# Patient Record
Sex: Female | Born: 1970 | Race: Asian | Hispanic: No | Marital: Married | Smoking: Never smoker
Health system: Southern US, Community
[De-identification: ages and names within clinical notes are randomized; demographics above are authoritative.]

## PROBLEM LIST (undated history)

## (undated) DIAGNOSIS — M419 Scoliosis, unspecified: Secondary | ICD-10-CM

## (undated) HISTORY — PX: BACK SURGERY: SHX140

---

## 2012-01-21 ENCOUNTER — Other Ambulatory Visit: Payer: Self-pay | Admitting: Orthopaedic Surgery

## 2012-01-21 DIAGNOSIS — IMO0002 Reserved for concepts with insufficient information to code with codable children: Secondary | ICD-10-CM

## 2012-01-22 ENCOUNTER — Ambulatory Visit
Admission: RE | Admit: 2012-01-22 | Discharge: 2012-01-22 | Disposition: A | Discharge status: Home / Self Care | Payer: BC Managed Care – PPO | Source: Ambulatory Visit | Attending: Orthopaedic Surgery | Admitting: Orthopaedic Surgery

## 2012-01-22 VITALS — BP 104/73 | HR 81 | Ht <= 58 in | Wt 115.0 lb

## 2012-01-22 DIAGNOSIS — IMO0002 Reserved for concepts with insufficient information to code with codable children: Secondary | ICD-10-CM

## 2012-01-22 MED ORDER — MEPERIDINE HCL 100 MG/ML IJ SOLN
50.0000 mg | Freq: Once | INTRAMUSCULAR | Status: AC
  Administered 2012-01-22: 50 mg via INTRAMUSCULAR

## 2012-01-22 MED ORDER — DIAZEPAM 5 MG PO TABS
5.0000 mg | ORAL_TABLET | Freq: Once | ORAL | Status: AC
  Administered 2012-01-22: 5 mg via ORAL

## 2012-01-22 MED ORDER — ONDANSETRON HCL 4 MG/2ML IJ SOLN
4.0000 mg | Freq: Once | INTRAMUSCULAR | Status: AC
  Administered 2012-01-22: 4 mg via INTRAMUSCULAR

## 2012-01-22 MED ORDER — IOHEXOL 180 MG/ML  SOLN
17.0000 mL | Freq: Once | INTRAMUSCULAR | Status: AC | PRN
  Administered 2012-01-22: 17 mL via INTRATHECAL

## 2012-03-10 ENCOUNTER — Encounter (HOSPITAL_COMMUNITY): Payer: Self-pay | Admitting: Pharmacy Technician

## 2012-03-16 ENCOUNTER — Encounter (HOSPITAL_COMMUNITY): Payer: Self-pay

## 2012-03-16 ENCOUNTER — Encounter (HOSPITAL_COMMUNITY)
Admission: RE | Admit: 2012-03-16 | Discharge: 2012-03-16 | Disposition: A | Discharge status: Home / Self Care | Payer: BC Managed Care – PPO | Source: Ambulatory Visit | Attending: Orthopaedic Surgery | Admitting: Orthopaedic Surgery

## 2012-03-16 HISTORY — DX: Scoliosis, unspecified: M41.9

## 2012-03-16 LAB — TYPE AND SCREEN
ABO/RH(D): O POS
Antibody Screen: NEGATIVE

## 2012-03-16 LAB — SURGICAL PCR SCREEN: Staphylococcus aureus: NEGATIVE

## 2012-03-16 NOTE — Pre-Procedure Instructions (Signed)
21 Danielle Watkins  03/16/2012   Your procedure is scheduled on:  Monday November 11  Report to [facility] at 10:30 AM.  Call this number if you have problems the morning of surgery: [phone]   Remember:   Do not eat or drink:After Midnight.    Take these medicines the morning of surgery with A SIP OF WATER: none   Do not wear jewelry, make-up or nail polish.  Do not wear lotions, powders, or perfumes. You may wear deodorant.  Do not shave 48 hours prior to surgery. Men may shave face and neck.  Do not bring valuables to the hospital.  Contacts, dentures or bridgework may not be worn into surgery.  Leave suitcase in the car. After surgery it may be brought to your room.  For patients admitted to the hospital, checkout time is 11:00 AM the day of discharge.   Patients discharged the day of surgery will not be allowed to drive home.  Name and phone number of your driver: NA  Special Instructions: Shower using CHG 2 nights before surgery and the night before surgery.  If you shower the day of surgery use CHG.  Use special wash - you have one bottle of CHG for all showers.  You should use approximately 1/3 of the bottle for each shower.   Please read over the following fact sheets that you were given: Pain Booklet, Coughing and Deep Breathing, Blood Transfusion Information and Surgical Site Infection Prevention

## 2012-03-16 NOTE — Progress Notes (Signed)
Need surgery orders, office aware and Dr. Yates will put orders in EPIC.  

## 2012-03-19 NOTE — H&P (Signed)
Danielle Watkins is an 41 y.o. female.   Chief Complaint: back pain and left leg pain HPI: Pt is S/P lumbar fusion at L5-S1 in [place] in 2007. Now with progressive back pain and burning pain in the left leg.  The pain is worse with activity and with sitting.  She has tried physical therapy without relief of her symptoms. Pt has used meloxicam and flexeril for her symptoms without much relief. CT of lumbar spine indicates broken rod on the right at L5-S1 and spondylolysis bilateral L5 with pseudoarthrosis. Also broad based disc bulge on the left at L4-5 with lateral recess stenosis. After risk and benefits of procedure discussed with DR Ophelia Charter, pt wishes to proceed with removal of hardware, rearthrodesis and redo instrumentation at L5-S1 with TLIF  and microdiscectomy Left L4-5 with lateral recess decompression.  Pts husband has obtained the information regarding the implanted hardware and it will be made available for use by Dr Ophelia Charter for the procedure.   Past Medical History  Diagnosis Date  . Scoliosis     Past Surgical History  Procedure Date  . Back surgery     lumbar fusion    No family history on file. Social History:  reports that she has never smoked. She has never used smokeless tobacco. She reports that she does not drink alcohol or use illicit drugs.  Allergies: No Known Allergies  Medications Prior to Admission  Medication Sig Dispense Refill  . Multiple Vitamin (MULTIVITAMIN WITH MINERALS) TABS Take 1 tablet by mouth daily.      Marland Kitchen OVER THE COUNTER MEDICATION Take 1 tablet by mouth daily. Fertility Blend OTC        No results found for this or any previous visit (from the past 48 hour(s)). No results found.  Review of Systems  Constitutional: Negative.   HENT: Negative.   Eyes: Negative.   Respiratory: Negative.   Genitourinary: Negative.   Musculoskeletal:       Burning and pain in back and left leg.  Worse with activity  Skin: Negative.   Endo/Heme/Allergies: Negative.       Blood pressure 129/89, pulse 88, temperature 97.8 F (36.6 C), temperature source Oral, resp. rate 20, SpO2 100.00%. Physical Exam  Constitutional: She is oriented to person, place, and time. She appears well-developed and well-nourished.  HENT:  Head: Normocephalic.  Eyes: EOM are normal. Pupils are equal, round, and reactive to light.  Neck: Normal range of motion. Neck supple.  Cardiovascular: Normal rate.   Respiratory: Effort normal.  Musculoskeletal:       Able to heel and toe walk without weakness.  +sciatic notch tenderness left.  +SLR left.    Neurological: She is oriented to person, place, and time. She has normal reflexes.  Skin: Skin is warm and dry.  Psychiatric: She has a normal mood and affect.     Assessment/Plan Left L4-5 disc protrusion with lateral recess stenosis and  L5-S1 pseudoarthrosis with broken rod  PLAN:  Removal of broken rod and pedicle instrumentation L5-S1, Left L5-S1 TLIF with possible peek cage and revision pedicle screw and rod instrumentation. Left L4-5 microdiscectomy  Zyrus Hetland C 03/22/2012, 12:57 PM

## 2012-03-22 ENCOUNTER — Other Ambulatory Visit (HOSPITAL_COMMUNITY): Payer: Self-pay | Admitting: Orthopedic Surgery

## 2012-03-22 ENCOUNTER — Inpatient Hospital Stay (HOSPITAL_COMMUNITY)
Admission: RE | Admit: 2012-03-22 | Discharge: 2012-03-24 | DRG: 755 | Disposition: A | Discharge status: Home / Self Care | Payer: BC Managed Care – PPO | Source: Ambulatory Visit | Attending: Orthopaedic Surgery | Admitting: Orthopaedic Surgery

## 2012-03-22 ENCOUNTER — Ambulatory Visit (HOSPITAL_COMMUNITY): Payer: BC Managed Care – PPO | Admitting: Certified Registered"

## 2012-03-22 ENCOUNTER — Encounter (HOSPITAL_COMMUNITY): Payer: Self-pay | Admitting: Certified Registered"

## 2012-03-22 ENCOUNTER — Encounter (HOSPITAL_COMMUNITY)
Admission: RE | Disposition: A | Discharge status: Home / Self Care | Payer: Self-pay | Source: Ambulatory Visit | Attending: Orthopaedic Surgery

## 2012-03-22 ENCOUNTER — Ambulatory Visit (HOSPITAL_COMMUNITY): Payer: BC Managed Care – PPO

## 2012-03-22 DIAGNOSIS — T8489XA Other specified complication of internal orthopedic prosthetic devices, implants and grafts, initial encounter: Secondary | ICD-10-CM | POA: Diagnosis present

## 2012-03-22 DIAGNOSIS — T84498A Other mechanical complication of other internal orthopedic devices, implants and grafts, initial encounter: Principal | ICD-10-CM | POA: Diagnosis present

## 2012-03-22 DIAGNOSIS — Z01812 Encounter for preprocedural laboratory examination: Secondary | ICD-10-CM

## 2012-03-22 DIAGNOSIS — Y831 Surgical operation with implant of artificial internal device as the cause of abnormal reaction of the patient, or of later complication, without mention of misadventure at the time of the procedure: Secondary | ICD-10-CM | POA: Diagnosis present

## 2012-03-22 DIAGNOSIS — M47817 Spondylosis without myelopathy or radiculopathy, lumbosacral region: Secondary | ICD-10-CM | POA: Diagnosis present

## 2012-03-22 DIAGNOSIS — M5126 Other intervertebral disc displacement, lumbar region: Secondary | ICD-10-CM | POA: Diagnosis present

## 2012-03-22 DIAGNOSIS — M4306 Spondylolysis, lumbar region: Secondary | ICD-10-CM | POA: Diagnosis present

## 2012-03-22 DIAGNOSIS — D62 Acute posthemorrhagic anemia: Secondary | ICD-10-CM | POA: Diagnosis not present

## 2012-03-22 SURGERY — POSTERIOR LUMBAR FUSION 1 WITH HARDWARE REMOVAL
Anesthesia: General | Site: Back | Wound class: Clean

## 2012-03-22 MED ORDER — CEFAZOLIN SODIUM 1-5 GM-% IV SOLN
INTRAVENOUS | Status: DC | PRN
  Administered 2012-03-22 (×2): 1 g via INTRAVENOUS

## 2012-03-22 MED ORDER — ACETAMINOPHEN 10 MG/ML IV SOLN: 1000.0000 mg | Freq: Once | INTRAVENOUS | Status: DC | PRN

## 2012-03-22 MED ORDER — ALBUMIN HUMAN 5 % IV SOLN
INTRAVENOUS | Status: DC | PRN
  Administered 2012-03-22: 16:00:00 via INTRAVENOUS

## 2012-03-22 MED ORDER — SODIUM CHLORIDE 0.9 % IJ SOLN: 3.0000 mL | INTRAMUSCULAR | Status: DC | PRN

## 2012-03-22 MED ORDER — MIDAZOLAM HCL 5 MG/5ML IJ SOLN
INTRAMUSCULAR | Status: DC | PRN
  Administered 2012-03-22: 2 mg via INTRAVENOUS

## 2012-03-22 MED ORDER — ACETAMINOPHEN 325 MG PO TABS
650.0000 mg | ORAL_TABLET | ORAL | Status: DC | PRN
  Administered 2012-03-24: 650 mg via ORAL
  Filled 2012-03-22: qty 2

## 2012-03-22 MED ORDER — SENNOSIDES-DOCUSATE SODIUM 8.6-50 MG PO TABS: 1.0000 | ORAL_TABLET | Freq: Every evening | ORAL | Status: DC | PRN

## 2012-03-22 MED ORDER — PHENYLEPHRINE HCL 10 MG/ML IJ SOLN
INTRAMUSCULAR | Status: DC | PRN
  Administered 2012-03-22 (×5): 40 ug via INTRAVENOUS

## 2012-03-22 MED ORDER — PROPOFOL 10 MG/ML IV BOLUS
INTRAVENOUS | Status: DC | PRN
  Administered 2012-03-22: 150 mg via INTRAVENOUS

## 2012-03-22 MED ORDER — FENTANYL CITRATE 0.05 MG/ML IJ SOLN
INTRAMUSCULAR | Status: DC | PRN
  Administered 2012-03-22 (×2): 50 ug via INTRAVENOUS
  Administered 2012-03-22: 100 ug via INTRAVENOUS
  Administered 2012-03-22 (×4): 50 ug via INTRAVENOUS

## 2012-03-22 MED ORDER — LACTATED RINGERS IV SOLN
INTRAVENOUS | Status: DC | PRN
  Administered 2012-03-22 (×3): via INTRAVENOUS

## 2012-03-22 MED ORDER — SODIUM CHLORIDE 0.9 % IJ SOLN
3.0000 mL | Freq: Two times a day (BID) | INTRAMUSCULAR | Status: DC
  Administered 2012-03-23: 3 mL via INTRAVENOUS

## 2012-03-22 MED ORDER — NEOSTIGMINE METHYLSULFATE 1 MG/ML IJ SOLN
INTRAMUSCULAR | Status: DC | PRN
  Administered 2012-03-22: 3 mg via INTRAVENOUS

## 2012-03-22 MED ORDER — THROMBIN 20000 UNITS EX SOLR
CUTANEOUS | Status: AC
  Filled 2012-03-22: qty 20000

## 2012-03-22 MED ORDER — PHENOL 1.4 % MT LIQD: 1.0000 | OROMUCOSAL | Status: DC | PRN

## 2012-03-22 MED ORDER — ONDANSETRON HCL 4 MG/2ML IJ SOLN: 4.0000 mg | INTRAMUSCULAR | Status: DC | PRN

## 2012-03-22 MED ORDER — PANTOPRAZOLE SODIUM 40 MG IV SOLR
40.0000 mg | Freq: Every day | INTRAVENOUS | Status: DC
  Administered 2012-03-22 – 2012-03-23 (×2): 40 mg via INTRAVENOUS
  Filled 2012-03-22 (×3): qty 40

## 2012-03-22 MED ORDER — MORPHINE SULFATE (PF) 1 MG/ML IV SOLN
INTRAVENOUS | Status: DC
  Administered 2012-03-22: 18:00:00 via INTRAVENOUS
  Administered 2012-03-23: 4 mg via INTRAVENOUS
  Administered 2012-03-23: via INTRAVENOUS
  Administered 2012-03-23: 7 mg via INTRAVENOUS
  Filled 2012-03-22 (×2): qty 25

## 2012-03-22 MED ORDER — CEFAZOLIN SODIUM 1-5 GM-% IV SOLN
1.0000 g | Freq: Three times a day (TID) | INTRAVENOUS | Status: AC
  Administered 2012-03-22 – 2012-03-23 (×2): 1 g via INTRAVENOUS
  Filled 2012-03-22 (×2): qty 50

## 2012-03-22 MED ORDER — ACETAMINOPHEN 10 MG/ML IV SOLN
INTRAVENOUS | Status: DC | PRN
  Administered 2012-03-22: 1000 mg via INTRAVENOUS

## 2012-03-22 MED ORDER — METHOCARBAMOL 100 MG/ML IJ SOLN
500.0000 mg | Freq: Four times a day (QID) | INTRAVENOUS | Status: DC | PRN
  Filled 2012-03-22: qty 5

## 2012-03-22 MED ORDER — HYDROCODONE-ACETAMINOPHEN 5-325 MG PO TABS: 1.0000 | ORAL_TABLET | ORAL | Status: DC | PRN

## 2012-03-22 MED ORDER — ONDANSETRON HCL 4 MG/2ML IJ SOLN
INTRAMUSCULAR | Status: DC | PRN
  Administered 2012-03-22: 4 mg via INTRAVENOUS

## 2012-03-22 MED ORDER — HYDROMORPHONE HCL PF 1 MG/ML IJ SOLN: 0.2500 mg | INTRAMUSCULAR | Status: DC | PRN

## 2012-03-22 MED ORDER — ONDANSETRON HCL 4 MG/2ML IJ SOLN: 4.0000 mg | Freq: Four times a day (QID) | INTRAMUSCULAR | Status: DC | PRN

## 2012-03-22 MED ORDER — SODIUM CHLORIDE 0.9 % IJ SOLN: 9.0000 mL | INTRAMUSCULAR | Status: DC | PRN

## 2012-03-22 MED ORDER — ONDANSETRON HCL 4 MG/2ML IJ SOLN: 4.0000 mg | Freq: Once | INTRAMUSCULAR | Status: DC | PRN

## 2012-03-22 MED ORDER — NALOXONE HCL 0.4 MG/ML IJ SOLN: 0.4000 mg | INTRAMUSCULAR | Status: DC | PRN

## 2012-03-22 MED ORDER — KETOROLAC TROMETHAMINE 30 MG/ML IJ SOLN
30.0000 mg | Freq: Once | INTRAMUSCULAR | Status: AC
  Administered 2012-03-22: 30 mg via INTRAVENOUS
  Filled 2012-03-22: qty 1

## 2012-03-22 MED ORDER — CEFAZOLIN SODIUM 1-5 GM-% IV SOLN
INTRAVENOUS | Status: AC
  Filled 2012-03-22: qty 50

## 2012-03-22 MED ORDER — ACETAMINOPHEN 650 MG RE SUPP: 650.0000 mg | RECTAL | Status: DC | PRN

## 2012-03-22 MED ORDER — ACETAMINOPHEN 10 MG/ML IV SOLN
INTRAVENOUS | Status: AC
  Filled 2012-03-22: qty 100

## 2012-03-22 MED ORDER — BISACODYL 10 MG RE SUPP: 10.0000 mg | Freq: Every day | RECTAL | Status: DC | PRN

## 2012-03-22 MED ORDER — ROCURONIUM BROMIDE 100 MG/10ML IV SOLN
INTRAVENOUS | Status: DC | PRN
  Administered 2012-03-22: 20 mg via INTRAVENOUS
  Administered 2012-03-22: 40 mg via INTRAVENOUS
  Administered 2012-03-22: 10 mg via INTRAVENOUS

## 2012-03-22 MED ORDER — LIDOCAINE HCL (CARDIAC) 20 MG/ML IV SOLN
INTRAVENOUS | Status: DC | PRN
  Administered 2012-03-22: 50 mg via INTRAVENOUS

## 2012-03-22 MED ORDER — DIPHENHYDRAMINE HCL 12.5 MG/5ML PO ELIX: 12.5000 mg | ORAL_SOLUTION | Freq: Four times a day (QID) | ORAL | Status: DC | PRN

## 2012-03-22 MED ORDER — KCL IN DEXTROSE-NACL 20-5-0.45 MEQ/L-%-% IV SOLN
INTRAVENOUS | Status: DC
  Administered 2012-03-22 – 2012-03-23 (×2): via INTRAVENOUS
  Filled 2012-03-22 (×5): qty 1000

## 2012-03-22 MED ORDER — MENTHOL 3 MG MT LOZG: 1.0000 | LOZENGE | OROMUCOSAL | Status: DC | PRN

## 2012-03-22 MED ORDER — LACTATED RINGERS IV SOLN: INTRAVENOUS | Status: DC

## 2012-03-22 MED ORDER — METHOCARBAMOL 500 MG PO TABS: 500.0000 mg | ORAL_TABLET | Freq: Four times a day (QID) | ORAL | Status: DC | PRN

## 2012-03-22 MED ORDER — ZOLPIDEM TARTRATE 5 MG PO TABS: 5.0000 mg | ORAL_TABLET | Freq: Every evening | ORAL | Status: DC | PRN

## 2012-03-22 MED ORDER — 0.9 % SODIUM CHLORIDE (POUR BTL) OPTIME
TOPICAL | Status: DC | PRN
  Administered 2012-03-22: 1000 mL

## 2012-03-22 MED ORDER — HEMOSTATIC AGENTS (NO CHARGE) OPTIME
TOPICAL | Status: DC | PRN
  Administered 2012-03-22: 1 via TOPICAL

## 2012-03-22 MED ORDER — GLYCOPYRROLATE 0.2 MG/ML IJ SOLN
INTRAMUSCULAR | Status: DC | PRN
  Administered 2012-03-22: 0.4 mg via INTRAVENOUS

## 2012-03-22 MED ORDER — SODIUM CHLORIDE 0.9 % IV SOLN: 250.0000 mL | INTRAVENOUS | Status: DC

## 2012-03-22 MED ORDER — DIPHENHYDRAMINE HCL 50 MG/ML IJ SOLN: 12.5000 mg | Freq: Four times a day (QID) | INTRAMUSCULAR | Status: DC | PRN

## 2012-03-22 MED ORDER — DOCUSATE SODIUM 100 MG PO CAPS
100.0000 mg | ORAL_CAPSULE | Freq: Two times a day (BID) | ORAL | Status: DC
  Administered 2012-03-22 – 2012-03-23 (×3): 100 mg via ORAL
  Filled 2012-03-22 (×6): qty 1

## 2012-03-22 MED ORDER — OXYCODONE-ACETAMINOPHEN 5-325 MG PO TABS
1.0000 | ORAL_TABLET | ORAL | Status: DC | PRN
  Filled 2012-03-22: qty 2

## 2012-03-22 MED ORDER — FLEET ENEMA 7-19 GM/118ML RE ENEM: 1.0000 | ENEMA | Freq: Once | RECTAL | Status: AC | PRN

## 2012-03-22 SURGICAL SUPPLY — 74 items
5.5x50mm rod (Rod) ×4 IMPLANT
BANDAGE ELASTIC 6 VELCRO ST LF (GAUZE/BANDAGES/DRESSINGS) IMPLANT
BENZOIN TINCTURE PRP APPL 2/3 (GAUZE/BANDAGES/DRESSINGS) ×2 IMPLANT
BLADE SURG 10 STRL SS (BLADE) ×2 IMPLANT
BLADE SURG ROTATE 9660 (MISCELLANEOUS) IMPLANT
BUR ROUND FLUTED 4 SOFT TCH (BURR) ×2 IMPLANT
CLOTH BEACON ORANGE TIMEOUT ST (SAFETY) ×2 IMPLANT
CLSR STERI-STRIP ANTIMIC 1/2X4 (GAUZE/BANDAGES/DRESSINGS) ×2 IMPLANT
CORDS BIPOLAR (ELECTRODE) ×2 IMPLANT
COVER SURGICAL LIGHT HANDLE (MISCELLANEOUS) ×2 IMPLANT
DRAPE C-ARM 42X72 X-RAY (DRAPES) ×2 IMPLANT
DRAPE MICROSCOPE LEICA (MISCELLANEOUS) ×2 IMPLANT
DRAPE ORTHO SPLIT 77X108 STRL (DRAPES) ×1
DRAPE PROXIMA HALF (DRAPES) ×4 IMPLANT
DRAPE SURG 17X23 STRL (DRAPES) ×6 IMPLANT
DRAPE SURG ORHT 6 SPLT 77X108 (DRAPES) ×1 IMPLANT
DRAPE TABLE COVER HEAVY DUTY (DRAPES) ×2 IMPLANT
DRSG EMULSION OIL 3X3 NADH (GAUZE/BANDAGES/DRESSINGS) ×2 IMPLANT
DRSG PAD ABDOMINAL 8X10 ST (GAUZE/BANDAGES/DRESSINGS) ×2 IMPLANT
DURAPREP 26ML APPLICATOR (WOUND CARE) ×2 IMPLANT
ELECT CAUTERY BLADE 6.4 (BLADE) ×2 IMPLANT
ELECT REM PT RETURN 9FT ADLT (ELECTROSURGICAL) ×2
ELECTRODE REM PT RTRN 9FT ADLT (ELECTROSURGICAL) ×1 IMPLANT
EVACUATOR 1/8 PVC DRAIN (DRAIN) IMPLANT
GLOVE BIOGEL PI IND STRL 7.5 (GLOVE) ×1 IMPLANT
GLOVE BIOGEL PI IND STRL 8 (GLOVE) ×1 IMPLANT
GLOVE BIOGEL PI INDICATOR 7.5 (GLOVE) ×1
GLOVE BIOGEL PI INDICATOR 8 (GLOVE) ×1
GLOVE ECLIPSE 7.0 STRL STRAW (GLOVE) ×2 IMPLANT
GLOVE ORTHO TXT STRL SZ7.5 (GLOVE) ×2 IMPLANT
GOWN PREVENTION PLUS LG XLONG (DISPOSABLE) IMPLANT
GOWN STRL NON-REIN LRG LVL3 (GOWN DISPOSABLE) ×6 IMPLANT
HEMOSTAT SURGICEL 2X14 (HEMOSTASIS) IMPLANT
KIT BASIN OR (CUSTOM PROCEDURE TRAY) ×2 IMPLANT
KIT ROOM TURNOVER OR (KITS) ×2 IMPLANT
MANIFOLD NEPTUNE II (INSTRUMENTS) ×2 IMPLANT
NDL SUT .5 MAYO 1.404X.05X (NEEDLE) ×1 IMPLANT
NEEDLE 22X1 1/2 (OR ONLY) (NEEDLE) ×2 IMPLANT
NEEDLE HYPO 25X1 1.5 SAFETY (NEEDLE) ×2 IMPLANT
NEEDLE MAYO TAPER (NEEDLE) ×1
NEEDLE SPNL 18GX3.5 QUINCKE PK (NEEDLE) ×4 IMPLANT
NS IRRIG 1000ML POUR BTL (IV SOLUTION) ×2 IMPLANT
PACK LAMINECTOMY ORTHO (CUSTOM PROCEDURE TRAY) ×2 IMPLANT
PAD ARMBOARD 7.5X6 YLW CONV (MISCELLANEOUS) ×4 IMPLANT
PATTIES SURGICAL .5 X.5 (GAUZE/BANDAGES/DRESSINGS) IMPLANT
PATTIES SURGICAL .75X.75 (GAUZE/BANDAGES/DRESSINGS) IMPLANT
PLUG POLARIS 5.5 (Orthopedic Implant) ×8 IMPLANT
SCREW POLARIS MULTI AX 6.5X45M (Screw) ×4 IMPLANT
SPACER LORDOTIC IBEX 9MM (Orthopedic Implant) ×2 IMPLANT
SPONGE GAUZE 4X4 12PLY (GAUZE/BANDAGES/DRESSINGS) ×2 IMPLANT
SPONGE LAP 18X18 X RAY DECT (DISPOSABLE) ×4 IMPLANT
SPONGE LAP 4X18 X RAY DECT (DISPOSABLE) ×2 IMPLANT
SPONGE SURGIFOAM ABS GEL SZ50 (HEMOSTASIS) ×2 IMPLANT
STAPLER VISISTAT 35W (STAPLE) IMPLANT
STRIP CLOSURE SKIN 1/2X4 (GAUZE/BANDAGES/DRESSINGS) IMPLANT
SURGIFLO TRUKIT (HEMOSTASIS) IMPLANT
SUT BONE WAX W31G (SUTURE) IMPLANT
SUT VIC AB 0 CT1 27 (SUTURE) ×2
SUT VIC AB 0 CT1 27XBRD ANBCTR (SUTURE) ×2 IMPLANT
SUT VIC AB 1 CT1 27 (SUTURE) ×1
SUT VIC AB 1 CT1 27XBRD ANBCTR (SUTURE) ×1 IMPLANT
SUT VIC AB 2-0 CT1 27 (SUTURE) ×1
SUT VIC AB 2-0 CT1 TAPERPNT 27 (SUTURE) ×1 IMPLANT
SUT VICRYL 0 TIES 12 18 (SUTURE) ×2 IMPLANT
SUT VICRYL 4-0 PS2 18IN ABS (SUTURE) IMPLANT
SUT VICRYL AB 2 0 TIES (SUTURE) ×2 IMPLANT
SYR CONTROL 10ML LL (SYRINGE) ×2 IMPLANT
TAPE CLOTH SURG 4X10 WHT LF (GAUZE/BANDAGES/DRESSINGS) ×2 IMPLANT
TOWEL OR 17X24 6PK STRL BLUE (TOWEL DISPOSABLE) ×2 IMPLANT
TOWEL OR 17X26 10 PK STRL BLUE (TOWEL DISPOSABLE) ×2 IMPLANT
TRAY FOLEY CATH 14FR (SET/KITS/TRAYS/PACK) ×2 IMPLANT
WATER STERILE IRR 1000ML POUR (IV SOLUTION) IMPLANT
YANKAUER SUCT BULB TIP NO VENT (SUCTIONS) ×2 IMPLANT
polaris 7.5x35mm screw (Screw) ×4 IMPLANT

## 2012-03-22 NOTE — Progress Notes (Signed)
Assessment done with help of interpreter

## 2012-03-22 NOTE — Transfer of Care (Signed)
Immediate Anesthesia Transfer of Care Note  Patient: Danielle Watkins  Procedure(s) Performed: Procedure(s) (LRB) with comments: POSTERIOR LUMBAR FUSION 1 WITH HARDWARE REMOVAL (N/A) - Gill Decompression, Removal Broken Rod and Pedicle Instrumentation L5-S1, Left TLIF L5-S1, Pedicle Instrumentation, PEEK cage.  Patient Location: PACU  Anesthesia Type:General  Level of Consciousness: awake, alert  and oriented  Airway & Oxygen Therapy: Patient Spontanous Breathing  Post-op Assessment: Report given to PACU RN  Post vital signs: stable  Complications: No apparent anesthesia complications

## 2012-03-22 NOTE — Progress Notes (Signed)
pca none used in pacu

## 2012-03-22 NOTE — Anesthesia Postprocedure Evaluation (Signed)
Anesthesia Post Note  Patient: Danielle Watkins  Procedure(s) Performed: Procedure(s) (LRB): POSTERIOR LUMBAR FUSION 1 WITH HARDWARE REMOVAL (N/A)  Anesthesia type: general  Patient location: PACU  Post pain: Pain level controlled  Post assessment: Patient's Cardiovascular Status Stable  Last Vitals:  Filed Vitals:   03/22/12 1736  BP:   Pulse: 64  Temp:   Resp: 12    Post vital signs: Reviewed and stable  Level of consciousness: sedated  Complications: No apparent anesthesia complications

## 2012-03-22 NOTE — Brief Op Note (Cosign Needed)
03/22/2012  4:45 PM  PATIENT:  Danielle Watkins  41 y.o. female  PRE-OPERATIVE DIAGNOSIS:  Left L4-5 Disc Protrusion , L5-S1 Pseudarthrosis, Broken Rod. Lateral Recess Stenosis.  POST-OPERATIVE DIAGNOSIS:  Left L4-5 Disc Protrusion , L5-S1 Pseudarthrosis, Broken Rod. Lateral Recess Stenosis.  PROCEDURE:  Procedure(s) (LRB) with comments: POSTERIOR LUMBAR FUSION 1 WITH HARDWARE REMOVAL (N/A) - Gill Decompression, Removal Broken Rod and Pedicle Instrumentation L5-S1, Left TLIF L5-S1, Pedicle Instrumentation, PEEK cage.  SURGEON:  Surgeon(s) and Role:    * Eldred Manges, MD - Primary  PHYSICIAN ASSISTANT: Maud Deed [facility]  ASSISTANTS: none   ANESTHESIA:   general  EBL:  Total I/O In: 2650 [I.V.:2200; Blood:200; IV Piggyback:250] Out: 1250 [Urine:650; Blood:600]  BLOOD ADMINISTERED:250 CC CELLSAVER  DRAINS: none   LOCAL MEDICATIONS USED:  NONE  SPECIMEN:  No Specimen  DISPOSITION OF SPECIMEN:  N/A  COUNTS:  YES  TOURNIQUET:  * No tourniquets in log *  DICTATION: .Note written in EPIC  PLAN OF CARE: Admit to inpatient   PATIENT DISPOSITION:  PACU - hemodynamically stable.   Delay start of Pharmacological VTE agent (>24hrs) due to surgical blood loss or risk of bleeding: yes

## 2012-03-22 NOTE — Anesthesia Preprocedure Evaluation (Addendum)
Anesthesia Evaluation  Patient identified by MRN, date of birth, ID band Patient awake    Reviewed: Allergy & Precautions, H&P , NPO status , Patient's Chart, lab work & pertinent test results, reviewed documented beta blocker date and time   Airway Mallampati: II      Dental  (+) Teeth Intact and Dental Advisory Given   Pulmonary neg pulmonary ROS,  breath sounds clear to auscultation        Cardiovascular negative cardio ROS  Rhythm:Regular Rate:Normal     Neuro/Psych negative neurological ROS  negative psych ROS   GI/Hepatic negative GI ROS,   Endo/Other  negative endocrine ROS  Renal/GU negative Renal ROS  negative genitourinary   Musculoskeletal negative musculoskeletal ROS (+)   Abdominal (+)  Abdomen: soft. Bowel sounds: normal.  Peds negative pediatric ROS (+)  Hematology negative hematology ROS (+)   Anesthesia Other Findings   Reproductive/Obstetrics negative OB ROS                         Anesthesia Physical Anesthesia Plan  ASA: II  Anesthesia Plan: General   Post-op Pain Management:    Induction: Intravenous  Airway Management Planned: Oral ETT  Additional Equipment:   Intra-op Plan:   Post-operative Plan: Extubation in OR  Informed Consent: I have reviewed the patients History and Physical, chart, labs and discussed the procedure including the risks, benefits and alternatives for the proposed anesthesia with the patient or authorized representative who has indicated his/her understanding and acceptance.   Dental advisory given  Plan Discussed with: CRNA and Surgeon  Anesthesia Plan Comments: (S/P Scoliosis Repair with broken hardware  Plan GA with oral ETT  Kipp Brood, MD)        Anesthesia Quick Evaluation

## 2012-03-22 NOTE — Progress Notes (Signed)
Orthopedic Tech Progress Note Patient Details:  Danielle Watkins 08/27/1970 [id number] Called in brace order to Greg at 7:11 pm will have first thing in morning. Patient ID: Danielle Watkins, female   DOB: December 03, 1970, 41 y.o.   MRN: [id number]   Danielle Watkins 03/22/2012, 7:11 PM

## 2012-03-22 NOTE — Preoperative (Signed)
Beta Blockers   Reason not to administer Beta Blockers:Not Applicable 

## 2012-03-22 NOTE — Interval H&P Note (Signed)
History and Physical Interval Note:  03/22/2012 1:01 PM  Danielle Watkins  has presented today for surgery, with the diagnosis of Left L4-5 Disc Protrusion , L5-S1 Pseudarthrosis, Broken Rod, Lateral Recess Stenosis  The various methods of treatment have been discussed with the patient and family. After consideration of risks, benefits and other options for treatment, the patient has consented to  Procedure(s) (LRB) with comments: POSTERIOR LUMBAR FUSION 1 WITH HARDWARE REMOVAL (N/A) - Removal Broken Rod and Pedicle Instrumentation L5-S1, Left TLIF L5-S1, Pedicle Instrumentation, PEEK cage, possible Left L4-5 Microdiscectomy as a surgical intervention .  The patient's history has been reviewed, patient examined, no change in status, stable for surgery.  I have reviewed the patient's chart and labs.  Questions were answered to the patient's satisfaction.     [name]

## 2012-03-22 NOTE — Anesthesia Procedure Notes (Addendum)
Procedure Name: Intubation Date/Time: 03/22/2012 1:19 PM Performed by: Ellin Goodie Pre-anesthesia Checklist: Patient identified, Emergency Drugs available, Suction available, Patient being monitored and Timeout performed Patient Re-evaluated:Patient Re-evaluated prior to inductionOxygen Delivery Method: Circle system utilized Preoxygenation: Pre-oxygenation with 100% oxygen Intubation Type: IV induction Ventilation: Mask ventilation without difficulty Laryngoscope Size: Mac and 3 Grade View: Grade I Tube type: Oral Tube size: 7.5 mm Number of attempts: 1 Airway Equipment and Method: Stylet Placement Confirmation: ETT inserted through vocal cords under direct vision,  positive ETCO2 and breath sounds checked- equal and bilateral Secured at: 22 cm Tube secured with: Tape Dental Injury: Teeth and Oropharynx as per pre-operative assessment  Comments: Induction by Dr. Noreene Larsson, Intubation by Verneita Griffes student.     Performed by: Ellin Goodie

## 2012-03-23 LAB — CBC
Hemoglobin: 9 g/dL — ABNORMAL LOW (ref 12.0–15.0)
MCH: 30.1 pg (ref 26.0–34.0)
MCHC: 34.6 g/dL (ref 30.0–36.0)

## 2012-03-23 LAB — BASIC METABOLIC PANEL
BUN: 4 mg/dL — ABNORMAL LOW (ref 6–23)
Calcium: 7.5 mg/dL — ABNORMAL LOW (ref 8.4–10.5)
GFR calc non Af Amer: >90 mL/min (ref >90)
Glucose, Bld: 105 mg/dL — ABNORMAL HIGH (ref 70–99)
Sodium: 137 mEq/L (ref 135–145)

## 2012-03-23 LAB — POCT I-STAT 4, (NA,K, GLUC, HGB,HCT): Potassium: 3.4 mEq/L — ABNORMAL LOW (ref 3.5–5.1)

## 2012-03-23 NOTE — Progress Notes (Signed)
Subjective: 1 Day Post-Op Procedure(s) (LRB): POSTERIOR LUMBAR FUSION 1 WITH HARDWARE REMOVAL (N/A) Patient reports pain as moderate.    Objective: Vital signs in last 24 hours: Temp:  [96.9 F (36.1 C)-98.8 F (37.1 C)] 98.1 F (36.7 C) (11/12 0514) Pulse Rate:  [64-100] 82  (11/12 0514) Resp:  [9-20] 16  (11/12 0514) BP: (103-129)/(65-89) 115/75 mmHg (11/12 0514) SpO2:  [90 %-100 %] 100 % (11/12 0514) Weight:  [53.524 kg (118 lb)] 53.524 kg (118 lb) (11/11 1810)  Intake/Output from previous day: 11/11 0701 - 11/12 0700 In: 3488.3 [P.O.:120; I.V.:2868.3; Blood:200; IV Piggyback:300] Out: 2850 [Urine:2250; Blood:600] Intake/Output this shift:     Basename 03/23/12 0600  HGB 9.0*    Basename 03/23/12 0600  WBC 7.0  RBC 2.99*  HCT 26.0*  PLT PENDING   No results found for this basename: NA:2,K:2,CL:2,CO2:2,BUN:2,CREATININE:2,GLUCOSE:2,CALCIUM:2 in the last 72 hours No results found for this basename: LABPT:2,INR:2 in the last 72 hours  Neurologically intact  Assessment/Plan: 1 Day Post-Op Procedure(s) (LRB): POSTERIOR LUMBAR FUSION 1 WITH HARDWARE REMOVAL (N/A) Up with therapy with brace.       CANNOT FIND CBC PREOP FOR COMPARISION IN EPIC.   YATES,MARK C 03/23/2012, 8:04 AM

## 2012-03-23 NOTE — Evaluation (Signed)
Physical Therapy Evaluation Patient Details Name: Danielle Watkins MRN: [id number] DOB: [birth year] Today's Date: 03/23/2012 Time: 4696-2952 PT Time Calculation (min): 40 min  PT Assessment / Plan / Recommendation Clinical Impression  Pt is a 41 y/o female admitted s/p L5-S1 fusion along with the below PT problem list. Pt would benefit from acute PT to maximize independence and facilitate d/c home with HHPT.    PT Assessment  Patient needs continued PT services    Follow Up Recommendations  Home health PT    Does the patient have the potential to tolerate intense rehabilitation      Barriers to Discharge None      Equipment Recommendations  Rolling walker with 5" wheels    Recommendations for Other Services     Frequency Min 5X/week    Precautions / Restrictions Precautions Precautions: Back Precaution Booklet Issued: Yes (comment) Precaution Comments: Educated pt on 3/3 back precautions. Required Braces or Orthoses: Spinal Brace Spinal Brace: Thoracolumbosacral orthotic;Applied in sitting position Restrictions Weight Bearing Restrictions: No   Pertinent Vitals/Pain 6/10 in back. Pt repositioned and PCA encouraged.      Mobility  Bed Mobility Bed Mobility: Rolling Left;Left Sidelying to Sit Rolling Left: 4: Min guard;With rail Left Sidelying to Sit: 4: Min assist;HOB flat Details for Bed Mobility Assistance: Assist to translate trunk to midline with cues for sequence. Limited by pain. Transfers Transfers: Sit to Stand;Stand to Sit Sit to Stand: 4: Min assist;With upper extremity assist;From bed Stand to Sit: 4: Min assist;With upper extremity assist;To chair/3-in-1 Details for Transfer Assistance: Assist for balance due to pain with cues for safest hand placement. Ambulation/Gait Ambulation/Gait Assistance: 4: Min assist Ambulation Distance (Feet): 200 Feet Assistive device: Rolling walker Ambulation/Gait Assistance Details: Assist for balance due to pain with  occassional help to avoid obstacles with RW. Cues for tall posture and safe sequence. Gait Pattern: Step-through pattern;Decreased stride length;Decreased weight shift to right;Decreased weight shift to left;Narrow base of support Stairs: No Wheelchair Mobility Wheelchair Mobility: No    Shoulder Instructions     Exercises     PT Diagnosis: Difficulty walking;Acute pain  PT Problem List: Decreased strength;Decreased activity tolerance;Decreased balance;Decreased mobility;Decreased knowledge of use of DME;Decreased knowledge of precautions;Pain PT Treatment Interventions: DME instruction;Gait training;Stair training;Functional mobility training;Therapeutic activities;Balance training;Patient/family education   PT Goals Acute Rehab PT Goals PT Goal Formulation: With patient/family Time For Goal Achievement: 03/30/12 Potential to Achieve Goals: Good Pt will Roll Supine to Right Side: with modified independence PT Goal: Rolling Supine to Right Side - Progress: Goal set today Pt will Roll Supine to Left Side: with modified independence PT Goal: Rolling Supine to Left Side - Progress: Goal set today Pt will go Supine/Side to Sit: with modified independence PT Goal: Supine/Side to Sit - Progress: Goal set today Pt will go Sit to Supine/Side: with modified independence PT Goal: Sit to Supine/Side - Progress: Goal set today Pt will go Sit to Stand: with modified independence PT Goal: Sit to Stand - Progress: Goal set today Pt will go Stand to Sit: with modified independence PT Goal: Stand to Sit - Progress: Goal set today Pt will Ambulate: >150 feet;with modified independence;with least restrictive assistive device PT Goal: Ambulate - Progress: Goal set today Pt will Go Up / Down Stairs: 3-5 stairs;with min assist;with least restrictive assistive device PT Goal: Up/Down Stairs - Progress: Goal set today  Visit Information  Last PT Received On: 03/23/12 Assistance Needed: +1      Subjective Data  Subjective: "I want  to try." Patient Stated Goal: Go home.   Prior Functioning  Home Living Lives With: Spouse Available Help at Discharge: Family;Available PRN/intermittently Type of Home: House Home Access: Stairs to enter [employer] of Steps: 4 Entrance Stairs-Rails: None Home Layout: One level Home Adaptive Equipment: None Prior Function Level of Independence: Independent Able to Take Stairs?: Yes Communication Communication: Prefers language other than English    Cognition  Overall Cognitive Status: Appears within functional limits for tasks assessed/performed Arousal/Alertness: Awake/alert Orientation Level: Appears intact for tasks assessed Behavior During Session: [facility] for tasks performed    Extremity/Trunk Assessment Right Upper Extremity Assessment RUE ROM/Strength/Tone: Within functional levels Left Upper Extremity Assessment LUE ROM/Strength/Tone: Within functional levels Right Lower Extremity Assessment RLE ROM/Strength/Tone: Within functional levels RLE Coordination: WFL - gross motor Left Lower Extremity Assessment LLE ROM/Strength/Tone: Within functional levels LLE Coordination: WFL - gross motor Trunk Assessment Trunk Assessment: Normal   Balance Balance Balance Assessed: No  End of Session PT - End of Session Equipment Utilized During Treatment: Gait belt;Back brace Activity Tolerance: Patient tolerated treatment well;Patient limited by pain Patient left: in chair;with call bell/phone within reach;with family/visitor present Nurse Communication: Mobility status  GP     Cephus Shelling 03/23/2012, 10:21 AM  03/23/2012 Cephus Shelling, PT, DPT [phone]

## 2012-03-23 NOTE — Op Note (Signed)
NAMEJUHI, LAGRANGE NO.:  [id number]  MEDICAL RECORD NO.:  [id number]  LOCATION:  [id number]                        FACILITY:  [facility]  PHYSICIAN:  Zaelyn Noack C. Ophelia Charter, M.D.    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 Recurrent lateral recess and central stenosis. 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 Recurrent lateral recess and central stenosis. L5-S1  PROCEDURE: L5 Gill procedure, removal of posterior elements, left L5-S1  TLIF, removal of broken rod, pedicle screws, and re-instrumentation with Biomet Polaris.  6.5 x 45 screws in L5 and 7.5 x 35 mm screws in S1 bilaterally.  50 mm pre-contoured titanium rods.  9 mm PEEK interbody cage with local bone.  Local bone anterior to the cage and bilateral intertransverse process fusion  L5-S1.  ( interbody and posterolateral fusion )  SURGEON:  Briar Sword C. Ophelia Charter, M.D.  ASSISTANTS:  Maud Deed, PA-C, medically necessary and present for the entire procedure.  EBL:  500 mL.  RE-TRANSFUSION:  200 mL Cell-Saver.  Intraoperative hemoglobin 10.3.  ANESTHESIA:  Got plus Marcaine skin local.  COMPLICATIONS:  None.  INDICATIONS:  This 41 year old female had a fusion in 2007 in [place] for spondylolisthesis with bilateral spondylolysis, instability, and hardware placed with the [place] brand presented with significant leg pain, back pain, worse with activities, and broken rod and pseudoarthrosis by CT scan with recurrent mild central and lateral recess stenosis.  Myelogram CT scan showed slight bulge at 4-5 and broken rods consistent with oblique x-rays on the right side and pseudarthrosis without healing.  There was  no interbody bone noted.  PROCEDURE:  After induction of general anesthesia and orotracheal intubation, the patient underwent placement in the prone position with spine frame careful padding and positioning.  The patient was carefully positioned, area was squared with 10/10 drapes over the lumbar spine. The old incision had slight curve to it, was relatively close to the midline.  The old scar was used using sterile skin marker after DuraPrep and dried.  Area squared with towels.  Betadine, Steri-Drape applied. Laminectomy sheet and time-out procedure.  Old incision was opened. Subperiosteal dissection out to the hardware.  Broken rod on the right was noted.  It was not present on the pedicle screws at L5 and these were removed.  The same ranch took out the screw and seated down over the screw.  Universal set from [employer] was available, but it did not fit despite at least 20 different sizes.  On the S1 pedicle, there was not a screwdriver that would fit.  Continued removal of soft tissue and some bone removal until the rod cutter could be placed and the titanium rod was cut on the left side.  It was already broken on the right side and then using scripts tripping the screw and the cut the rod twisting the screws backed down removing all hardware.  The screwdriver that the patient's  husband brought had been sterilized from the [employer] fit for the cross connector, and this was unscrewed and removed.  After irrigation, some Gelfoam was placed in the pedicle holes that were bleeding.  Decompression centrally was performed, operative microscope was draped and brought in, and scar tissue was removed freeing up fibrous tissue from the dura.  Removal of bone laterally in the gutters was performed as well as thick chunks of hypertrophic ligamentum.  At the pars defect, there was central narrowing and lateral recess stenosis.  Bone was removed on the left side above the S1 pedicle palpating  the foramina, removing the facet, and then using microscope, microdiskectomy was performed on the left side.  The flares instrumentation with angled curettes, straight curettes, angled rasp, straight rasp, ring curettes, angled ring curettes, tiny Epstein type curettes were all used for endplate preparation and complete removal of as much disk was able to be removed still leaving the anterior annulus intact.  Once it was cleaned out, there was good bleeding endplates size and showed that a 9 mm was extremely tight.  This was based on a 7 and 9 blade, and 7 and 9 box.  Trials were used, 9 gave an excellent tight fit with distraction looked good under x-ray.  9 cage was opened.  The patient's some bone had been meticulously cleaned and placed in a small chunks, was then placed into the disk space, progressively compressed with straight impactors, angle impactors, and using the 7-mm trial as the impactor as well as the hammer.  Once significant amount of bone was placed, the 9 mm cage was attempted to be inserted.  It would not advance and screws were placed on that side as described above and then distraction between the screws initially and then distraction between the bone of the pedicles allowed just enough opening for the cage to be passed progress forward and then kicked over until it was almost parallel.  There was slight tilting to it, it was extremely tight.  It did not advance any further, and was countersunk several mm.  After irrigation with saline solution, screws were placed on the other side. 50 mm rod was appropriate.  The left TLIF side was clicked and compressed down first.  Right caps were tightened down until it collected 100 pounds and then opposite side on the right was done. Couple mm of rod was sticking out on the end of each screw.  There was good securement and with bone anterior to the cage, bone in the cage, cage kicked in good position, extremely tight and then  lateral bone placed out in both transverse processes.  It was felt that cross connector was not necessary based on biomechanical settings.  Gutters had been meticulously cleaned, Gelfoam was removed.  Some bleeders were coagulated with bipolar.  Deep layer closed with #1 Vicryl, 2-0 Vicryl subcutaneous tissue.  Due to some oozing, it was elected to close with Steri-Strips.  Postop dressing was applied and the patient was transferred to the recovery room.  Instrument count and needle count was correct.  Procedure was as planned and with the obvious nonunion, broken rods, and motion at that level, it was elected not to proceed with the microdiskectomy at the 4-5 level and with palpation with the hockey stick up to the level on the left side the patient had not been particularly tight and there was hypertrophic overgrowth of the pars defect that was causing lateral recess stenosis and once nonunion was felt that  was the cause of patient's pain.  The patient was transferred to the recovery room in a stable condition.  Dura was intact.     Bobbe Quilter C. Ophelia Charter, M.D.     MCY/MEDQ  D:  03/22/2012  T:  03/23/2012  Job:  [id number]

## 2012-03-23 NOTE — Evaluation (Signed)
I agree with the following treatment note after reviewing documentation.   Johnston, Dain Laseter Brynn   OTR/L Pager: [phone] Office: [phone] .   

## 2012-03-23 NOTE — Progress Notes (Signed)
I agree with the following treatment note after reviewing documentation.   Johnston, Reinhart Saulters Brynn   OTR/L Pager: [phone] Office: [phone] .   

## 2012-03-23 NOTE — Progress Notes (Signed)
Orthopedic Tech Progress Note Patient Details:  Joci Dress 05/21/1970 [id number]  Patient ID: Trena Platt, female   DOB: Feb 10, 1971, 41 y.o.   MRN: [id number]   Shawnie Pons 03/23/2012, 9:27 AM ASPEN LUMBAR FUSION OX BRACE COMPLETED BY BIO-TECH

## 2012-03-23 NOTE — Progress Notes (Signed)
Orthopedic Tech Progress Note Patient Details:  Danielle Watkins [birth year] [id number]  Patient ID: Danielle Watkins, female   DOB: May 22, 1970, 41 y.o.   MRN: [id number]   Danielle Watkins 03/23/2012, 8:53 AM Called bio tech for lumbar fusion brace Tuesday 8:53am .

## 2012-03-23 NOTE — Evaluation (Signed)
Occupational Therapy Evaluation Patient Details Name: Danielle Watkins MRN: [id number] DOB: 1970-11-30 Today's Date: 03/23/2012 Time: 4098-1191 OT Time Calculation (min): 28 min  OT Assessment / Plan / Recommendation Clinical Impression  Pt 41 yo female s/p lumbar fusion and hardware removal. Would benefit from OT acutely to increase independence with ADL's.    OT Assessment  Patient needs continued OT Services    Follow Up Recommendations  Supervision - Intermittent    Barriers to Discharge      Equipment Recommendations  Rolling walker with 5" wheels    Recommendations for Other Services    Frequency  Min 2X/week    Precautions / Restrictions Precautions Precautions: Back Precaution Booklet Issued: Yes (comment) Precaution Comments: Educated pt on 3/3 back precautions. Required Braces or Orthoses: Spinal Brace Spinal Brace: Thoracolumbosacral orthotic;Applied in sitting position Restrictions Weight Bearing Restrictions: No   Pertinent Vitals/Pain 6/10 pain, pt requesting pain meds, RN Becca made aware    ADL  Grooming: Performed;Supervision/safety;Wash/dry hands;Wash/dry face;Teeth care Where Assessed - Grooming: Supported standing Toilet Transfer: Mining engineer Method: Sit to Barista: Other (comment) (chair ) Equipment Used: Gait belt;Rolling walker;Back brace Transfers/Ambulation Related to ADLs: cues for hand placement during sit<>stand. Cues to keep RW in front when completing sink level tasks ADL Comments: Pt and husband able to recall 2/3 precautions. Ambulated to bathroom with min (A) and cues for safe use of RW. Able to stand at sink level to brush teeth with supervision.     OT Diagnosis: Generalized weakness;Acute pain  OT Problem List: Decreased activity tolerance;Decreased knowledge of use of DME or AE;Decreased safety awareness;Decreased knowledge of precautions;Pain OT Treatment Interventions:  Self-care/ADL training;Therapeutic exercise;DME and/or AE instruction;Therapeutic activities;Patient/family education   OT Goals Acute Rehab OT Goals OT Goal Formulation: With patient Time For Goal Achievement: 04/06/12 Potential to Achieve Goals: Good ADL Goals Pt Will Perform Upper Body Dressing: with supervision;Sitting, chair;Sitting, bed ADL Goal: Upper Body Dressing - Progress: Goal set today Pt Will Perform Lower Body Dressing: with adaptive equipment;with supervision ADL Goal: Lower Body Dressing - Progress: Goal set today Pt Will Transfer to Toilet: with supervision;Ambulation;Regular height toilet;Maintaining back safety precautions ADL Goal: Toilet Transfer - Progress: Goal set today Pt Will Perform Toileting - Clothing Manipulation: with supervision;Sitting on 3-in-1 or toilet;Standing ADL Goal: Toileting - Clothing Manipulation - Progress: Goal set today Pt Will Perform Toileting - Hygiene: with supervision;Sit to stand from 3-in-1/toilet ADL Goal: Toileting - Hygiene - Progress: Goal set today Miscellaneous OT Goals Miscellaneous OT Goal #1: Pt will recall 3/3 precautions as precursor to ADL's  OT Goal: Miscellaneous Goal #1 - Progress: Goal set today  Visit Information  Last OT Received On: 03/23/12 Assistance Needed: +1    Subjective Data  Subjective: I want my hair up  Patient Stated Goal: To go home    Prior Functioning     Home Living Lives With: Spouse Available Help at Discharge: Family;Available PRN/intermittently Type of Home: House Home Access: Stairs to enter [employer] of Steps: 4 Entrance Stairs-Rails: None Home Layout: One level Bathroom Shower/Tub: Forensic scientist: Standard Bathroom Accessibility: Yes How Accessible: Accessible via walker Home Adaptive Equipment: None Prior Function Level of Independence: Independent Able to Take Stairs?: Yes Driving: Yes Vocation: Unemployed  (housewife) Communication Communication: Prefers language other than English Dominant Hand: Right         Vision/Perception     Cognition  Overall Cognitive Status: Appears within functional limits for tasks assessed/performed Arousal/Alertness: Awake/alert Orientation  Level: Appears intact for tasks assessed Behavior During Session: [facility] for tasks performed    Extremity/Trunk Assessment Right Upper Extremity Assessment RUE ROM/Strength/Tone: Within functional levels Left Upper Extremity Assessment LUE ROM/Strength/Tone: Within functional levels Right Lower Extremity Assessment RLE ROM/Strength/Tone: Within functional levels RLE Coordination: WFL - gross motor Left Lower Extremity Assessment LLE ROM/Strength/Tone: Within functional levels LLE Coordination: WFL - gross motor Trunk Assessment Trunk Assessment: Normal     Mobility Bed Mobility Bed Mobility: Not assessed Rolling Left: 4: Min guard;With rail Left Sidelying to Sit: 4: Min assist;HOB flat Details for Bed Mobility Assistance: Assist to translate trunk to midline with cues for sequence. Limited by pain. Transfers Transfers: Stand to Sit;Sit to Stand Sit to Stand: 4: Min assist;With upper extremity assist;From chair/3-in-1;With armrests Stand to Sit: 4: Min assist;With upper extremity assist;To chair/3-in-1 Details for Transfer Assistance: required cues for hand placement      Shoulder Instructions     Exercise     Balance Balance Balance Assessed: No   End of Session OT - End of Session Equipment Utilized During Treatment: Gait belt;Back brace Activity Tolerance: Patient tolerated treatment well Patient left: in chair;with call bell/phone within reach;with family/visitor present Nurse Communication: Mobility status;Patient requests pain meds  GO     Cleora Fleet 03/23/2012, 12:06 PM

## 2012-03-23 NOTE — Progress Notes (Signed)
Occupational Therapy Treatment Patient Details Name: Danielle Watkins MRN: [id number] DOB: 06/20/1970 Today's Date: 03/23/2012 Time: 4098-1191 OT Time Calculation (min): 36 min  OT Assessment / Plan / Recommendation Comments on Treatment Session Pt stating she is feeling better than this morning. Educated on AE for use with LB bathing and dressing, able to dress LB without use of AE. Pt doing better this afternoon.     Follow Up Recommendations  Supervision - Intermittent    Barriers to Discharge       Equipment Recommendations  Rolling walker with 5" wheels    Recommendations for Other Services    Frequency Min 2X/week   Plan Discharge plan remains appropriate    Precautions / Restrictions Precautions Precautions: Back Required Braces or Orthoses: Spinal Brace Spinal Brace: Thoracolumbosacral orthotic;Applied in sitting position Restrictions Weight Bearing Restrictions: No   Pertinent Vitals/Pain No pain score stated     ADL  Lower Body Dressing: Performed;Supervision/safety Where Assessed - Lower Body Dressing: Supported sit to stand Toilet Transfer: Buyer, retail Method: Sit to Barista: Other (comment) (from chair ) Equipment Used: Gait belt;Rolling walker;Back brace Transfers/Ambulation Related to ADLs: good hand placement this session, required less physical (A)  ADL Comments: Able to recall 3/3 back precuations. Educated pt and husband on AE in hip kit and discussed purchasing at the gift shop. Able to dress LB at supervision level without use of AE. Performed transfer wtih supervision and bed mobility with min (A)      OT Diagnosis:    OT Problem List:   OT Treatment Interventions:     OT Goals Acute Rehab OT Goals OT Goal Formulation: With patient Time For Goal Achievement: 04/06/12 Potential to Achieve Goals: Good ADL Goals Pt Will Perform Upper Body Dressing: with supervision;Sitting, chair;Sitting, bed Pt  Will Perform Lower Body Dressing: with adaptive equipment;with supervision ADL Goal: Lower Body Dressing - Progress: Met Pt Will Transfer to Toilet: with supervision;Ambulation;Regular height toilet;Maintaining back safety precautions ADL Goal: Toilet Transfer - Progress: Progressing toward goals Pt Will Perform Toileting - Clothing Manipulation: with supervision;Sitting on 3-in-1 or toilet;Standing ADL Goal: Toileting - Clothing Manipulation - Progress: Progressing toward goals Pt Will Perform Toileting - Hygiene: with supervision;Sit to stand from 3-in-1/toilet Miscellaneous OT Goals Miscellaneous OT Goal #1: Pt will recall 3/3 precautions as precursor to ADL's  OT Goal: Miscellaneous Goal #1 - Progress: Met  Visit Information  Last OT Received On: 03/23/12 Assistance Needed: +1    Subjective Data      Prior Functioning       Cognition  Overall Cognitive Status: Appears within functional limits for tasks assessed/performed Arousal/Alertness: Awake/alert Orientation Level: Appears intact for tasks assessed Behavior During Session: [facility] for tasks performed    Mobility   Bed Mobility Bed Mobility: Rolling Left;Left Sidelying to Sit;Sit to Sidelying Left Rolling Left: 4: Min guard;With rail Left Sidelying to Sit: 4: Min assist;HOB flat Sitting - Scoot to Edge of Bed: 4: Min guard Sit to Sidelying Left: 4: Min assist;HOB flat Details for Bed Mobility Assistance: (A) to bring shoulders OOB and with bringning bil LE's onto bed.  Transfers Transfers: Stand to Sit;Sit to Stand Sit to Stand: 5: Supervision;With upper extremity assist;From bed Stand to Sit: 5: Supervision;With upper extremity assist;To bed Details for Transfer Assistance: no cues or physical (A) required this session                 End of Session OT - End of Session Equipment Utilized During  Treatment: Gait belt;Back brace Activity Tolerance: Patient tolerated treatment well Patient left: in bed;with call  bell/phone within reach;with family/visitor present Nurse Communication: Mobility status  GO     Cleora Fleet 03/23/2012, 2:11 PM

## 2012-03-23 NOTE — Progress Notes (Signed)
Utilization review completed. Baleria Wyman, RN, BSN. 

## 2012-03-24 DIAGNOSIS — M4306 Spondylolysis, lumbar region: Secondary | ICD-10-CM | POA: Diagnosis present

## 2012-03-24 MED ORDER — METHOCARBAMOL 500 MG PO TABS: 500.0000 mg | ORAL_TABLET | Freq: Four times a day (QID) | ORAL | Status: DC | PRN

## 2012-03-24 MED ORDER — OXYCODONE-ACETAMINOPHEN 5-325 MG PO TABS: 1.0000 | ORAL_TABLET | ORAL | Status: DC | PRN

## 2012-03-24 MED ORDER — PANTOPRAZOLE SODIUM 40 MG PO TBEC: 40.0000 mg | DELAYED_RELEASE_TABLET | Freq: Every day | ORAL | Status: DC

## 2012-03-24 NOTE — Progress Notes (Addendum)
Subjective: 2 Days Post-Op Procedure(s) (LRB): POSTERIOR LUMBAR FUSION 1 WITH HARDWARE REMOVAL (N/A) Patient reports pain as 4 on 0-10 scale.    Objective: Vital signs in last 24 hours: Temp:  [97.8 F (36.6 C)-98.7 F (37.1 C)] 97.8 F (36.6 C) (11/13 0558) Pulse Rate:  [76-105] 76  (11/13 0558) Resp:  [15-18] 16  (11/13 0558) BP: (109-112)/(66-74) 110/66 mmHg (11/13 0558) SpO2:  [97 %-100 %] 97 % (11/13 0558)  Intake/Output from previous day: 11/12 0701 - 11/13 0700 In: 2831.7 [P.O.:240; I.V.:2581.7; IV Piggyback:10] Out: 250 [Urine:250] Intake/Output this shift:     Basename 03/23/12 0600 03/22/12 1503  HGB 9.0* 10.2*    Basename 03/23/12 0600 03/22/12 1503  WBC 7.0 --  RBC 2.99* --  HCT 26.0* 30.0*  PLT 109* --    Basename 03/23/12 0600 03/22/12 1503  NA 137 140  K 3.3* 3.4*  CL 104 --  CO2 22 --  BUN 4* --  CREATININE 0.68 --  GLUCOSE 105* 92  CALCIUM 7.5* --   No results found for this basename: LABPT:2,INR:2 in the last 72 hours  Neurologically intact  Assessment/Plan: 2 Days Post-Op Procedure(s) (LRB): POSTERIOR LUMBAR FUSION 1 WITH HARDWARE REMOVAL (N/A) Up with therapy possible home this afternoon.   D/C  IV   Dressing change this AM  Tamaka Sawin C 03/24/2012, 7:58 AM

## 2012-03-24 NOTE — Progress Notes (Signed)
Patient discharged in stable condition via wheelchair. Discharge instructions and prescriptions were given and explained 

## 2012-03-24 NOTE — Discharge Summary (Signed)
Physician Discharge Summary  Patient ID: Danielle Watkins MRN: [id number] DOB/AGE: Nov 01, 1970 41 y.o.  Admit date: 03/22/2012 Discharge date: 03/24/2012  Admission Diagnoses:  Spondylolysis of lumbar region bilateral L5, S/P instrumented fusion L5-S1 eith pseudoarthrosis and broken rod.  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 Scoliosis     Surgeries: Procedure(s): POSTERIOR LUMBAR FUSION 1 WITH HARDWARE REMOVAL on 03/22/2012. Gill procedure, removal of hardware and reinstrumentation at L5-S1, TLIF left L5-S1 with local bone graft   Consultants (if any):  none  Discharged Condition: Improved  Hospital Course: Danielle Watkins is an 41 y.o. female who was admitted 03/22/2012 with a diagnosis of Spondylolysis of lumbar region and went to the operating room on 03/22/2012 and underwent the above named procedures.    She was given perioperative antibiotics:      Anti-infectives     Start     Dose/Rate Route Frequency Ordered Stop   03/23/12 0000   ceFAZolin (ANCEF) IVPB 1 g/50 mL premix        1 g 100 mL/hr over 30 Minutes Intravenous Every 8 hours 03/22/12 1835 03/23/12 0849        .  She was given sequential compression devices, early ambulation* for DVT prophylaxis. Aspen LSO utilized for ambulation.  Pt tolerated PT well and was ambulatory at discharge.  Pain controlled with po meds.  Taking a regular diet and bowel and bladder function appropriate. Incision healing without drainage.  She benefited maximally from the hospital stay and there were no complications.    Recent vital signs:  Filed Vitals:   03/24/12 1300  BP: 107/63  Pulse: 116  Temp: 97.4 F (36.3 C)  Resp: 18    Recent laboratory studies:  Lab Results  Component Value Date   HGB 9.0* 03/23/2012   HGB 10.2* 03/22/2012   Lab Results    Component Value Date   WBC 7.0 03/23/2012   PLT 109* 03/23/2012   No results found for this basename: INR   Lab Results  Component Value Date   NA 137 03/23/2012   K 3.3* 03/23/2012   CL 104 03/23/2012   CO2 22 03/23/2012   BUN 4* 03/23/2012   CREATININE 0.68 03/23/2012   GLUCOSE 105* 03/23/2012    Discharge Medications:     Medication List     As of 03/24/2012  2:48 PM    TAKE these medications         methocarbamol 500 MG tablet   Commonly known as: ROBAXIN   Take 1 tablet (500 mg total) by mouth every 6 (six) hours as needed (muscle spasm).      multivitamin with minerals Tabs   Take 1 tablet by mouth daily.      OVER THE COUNTER MEDICATION   Take 1 tablet by mouth daily. Fertility Blend OTC      oxyCODONE-acetaminophen 5-325 MG per tablet   Commonly known as: PERCOCET/ROXICET   Take 1-2 tablets by mouth every 4 (four) hours as needed.         Diagnostic Studies: Dg Lumbar Spine 2-3 Views  03/22/2012  *RADIOLOGY REPORT*  Clinical Data: Back pain  DG C-ARM 1-60 MIN,LUMBAR SPINE - 2-3 VIEW  Comparison: 01/22/2012  Findings: C-arm films document revision of L5-S1 PLIF. Pedicle screw and interbody cage grossly satisfactory position.  Slight anterolisthesis L5-S1.  Recommend plain films  postoperatively to assess.  IMPRESSION: As above.   Original Report Authenticated By: Davonna Belling, M.D.    Dg C-arm 1-60 Min  03/22/2012  *RADIOLOGY REPORT*  Clinical Data: Back pain  DG C-ARM 1-60 MIN,LUMBAR SPINE - 2-3 VIEW  Comparison: 01/22/2012  Findings: C-arm films document revision of L5-S1 PLIF. Pedicle screw and interbody cage grossly satisfactory position.  Slight anterolisthesis L5-S1.  Recommend plain films postoperatively to assess.  IMPRESSION: As above.   Original Report Authenticated By: Davonna Belling, M.D.     Disposition: Final discharge disposition not confirmed  Discharge Orders    Future Orders Please Complete By Expires   Diet - low sodium heart healthy       Call MD / Call 911      Comments:   If you experience chest pain or shortness of breath, CALL 911 and be transported to the hospital emergency room.  If you develope a fever above 101 F, pus (white drainage) or increased drainage or redness at the wound, or calf pain, call your surgeon's office.   Constipation Prevention      Comments:   Drink plenty of fluids.  Prune juice may be helpful.  You may use a stool softener, such as Colace (over the counter) 100 mg twice a day.  Use MiraLax (over the counter) for constipation as needed.   Increase activity slowly as tolerated      Driving restrictions      Comments:   No driving   Lifting restrictions      Comments:   No lifting    Use brace when sitting and out of bed even to go to bathroom. Walk in house for first 2 weeks then may start to get out slowly increasing distances up to one mile by 4-6 weeks post op. After 5 days may shower and change dressing following bathing with shower.When bathing remove the brace shower and replace brace before getting out of the shower. If drainage, keep dry dressing and do not bathe the incision, use an moisture impervious dressing. Please call and return for scheduled follow up appointment 2 weeks from the time of surgery.  Follow-up Information    Follow up with Eldred Manges, MD. Schedule an appointment as soon as possible for a visit in 2 weeks.   Contact information:   [address] [place] [address] [place] [address] [phone]           Signed: Wende Neighbors 03/24/2012, 2:48 PM

## 2012-03-24 NOTE — Progress Notes (Signed)
Physical Therapy Treatment Patient Details Name: Danielle Watkins MRN: [id number] DOB: 25-Jan-1971 Today's Date: 03/24/2012 Time: 1035-1100 PT Time Calculation (min): 25 min  PT Assessment / Plan / Recommendation Comments on Treatment Session  Pt admitted s/p lumbar fusion and continues to progress with therapy. Pt motivated and able to increase ambulation distance and independence as well as tolerate stair negotiation this am.    Follow Up Recommendations  Home health PT     Does the patient have the potential to tolerate intense rehabilitation     Barriers to Discharge        Equipment Recommendations  Rolling walker with 5" wheels    Recommendations for Other Services    Frequency Min 5X/week   Plan Discharge plan remains appropriate;Frequency remains appropriate    Precautions / Restrictions Precautions Precautions: Back Precaution Booklet Issued: No Precaution Comments: Pt and husband able to recall 3/3 back precautions. Required Braces or Orthoses: Spinal Brace Spinal Brace: Thoracolumbosacral orthotic;Applied in sitting position Restrictions Weight Bearing Restrictions: No   Pertinent Vitals/Pain 3/10 in back. Pt repositioned.    Mobility  Bed Mobility Bed Mobility: Rolling Right;Right Sidelying to Sit Rolling Right: 6: Modified independent (Device/Increase time);With rail Right Sidelying to Sit: 4: Min assist;HOB flat Details for Bed Mobility Assistance: Assist for trunk to translate to midline. Cues for sequence and safety. Transfers Transfers: Sit to Stand;Stand to Sit Sit to Stand: 5: Supervision;With upper extremity assist;From bed Stand to Sit: 5: Supervision;With upper extremity assist;To toilet Details for Transfer Assistance: Verbal cues for safest hand placement and sequence. Ambulation/Gait Ambulation/Gait Assistance: 4: Min guard Ambulation Distance (Feet): 250 Feet Assistive device: Rolling walker Ambulation/Gait Assistance Details: Guarding for  balance with cues for safety inside RW. Gait Pattern: Step-through pattern;Decreased stride length;Decreased weight shift to right;Decreased weight shift to left;Narrow base of support Stairs: Yes Stairs Assistance: 4: Min assist Stairs Assistance Details (indicate cue type and reason): Assist for balance with cues for sequence. Husband able to provide appropriate unilateral HHA. Stair Management Technique: Step to pattern;Forwards;No rails;Other (comment) (Unilateral HHA) Number of Stairs: 4  Wheelchair Mobility Wheelchair Mobility: No    Exercises     PT Diagnosis:    PT Problem List:   PT Treatment Interventions:     PT Goals Acute Rehab PT Goals PT Goal Formulation: With patient/family Time For Goal Achievement: 03/30/12 Potential to Achieve Goals: Good PT Goal: Rolling Supine to Right Side - Progress: Met PT Goal: Supine/Side to Sit - Progress: Progressing toward goal PT Goal: Sit to Stand - Progress: Progressing toward goal PT Goal: Stand to Sit - Progress: Progressing toward goal PT Goal: Ambulate - Progress: Progressing toward goal PT Goal: Up/Down Stairs - Progress: Progressing toward goal  Visit Information  Last PT Received On: 03/24/12 Assistance Needed: +1    Subjective Data  Subjective: "I feel better than yesterday." Patient Stated Goal: Go home.   Cognition  Overall Cognitive Status: Appears within functional limits for tasks assessed/performed Arousal/Alertness: Awake/alert Orientation Level: Appears intact for tasks assessed Behavior During Session: [facility] for tasks performed    Balance  Balance Balance Assessed: No  End of Session PT - End of Session Equipment Utilized During Treatment: Gait belt;Back brace Activity Tolerance: Patient tolerated treatment well Patient left: with call bell/phone within reach;with family/visitor present (On toilet in bathroom with husband.) Nurse Communication: Mobility status   GP     Cephus Shelling 03/24/2012,  11:09 AM  03/24/2012 Cephus Shelling, PT, DPT [id number]

## 2012-03-25 MED FILL — Heparin Sodium (Porcine) Inj 1000 Unit/ML: INTRAMUSCULAR | Qty: 30 | Status: AC

## 2012-03-25 MED FILL — Sodium Chloride IV Soln 0.9%: INTRAVENOUS | Qty: 1000 | Status: AC

## 2013-09-18 IMAGING — RF DG LUMBAR SPINE 2-3V
1 series · 2 of 2 positions shown · non-contrast
Comparison: 01/22/2012

CLINICAL DATA: Back pain

DG C-ARM 1-60 MIN,LUMBAR SPINE - 2-3 VIEW

[Series 1: run · 2 of 2 slices shown]
[im 1/2]
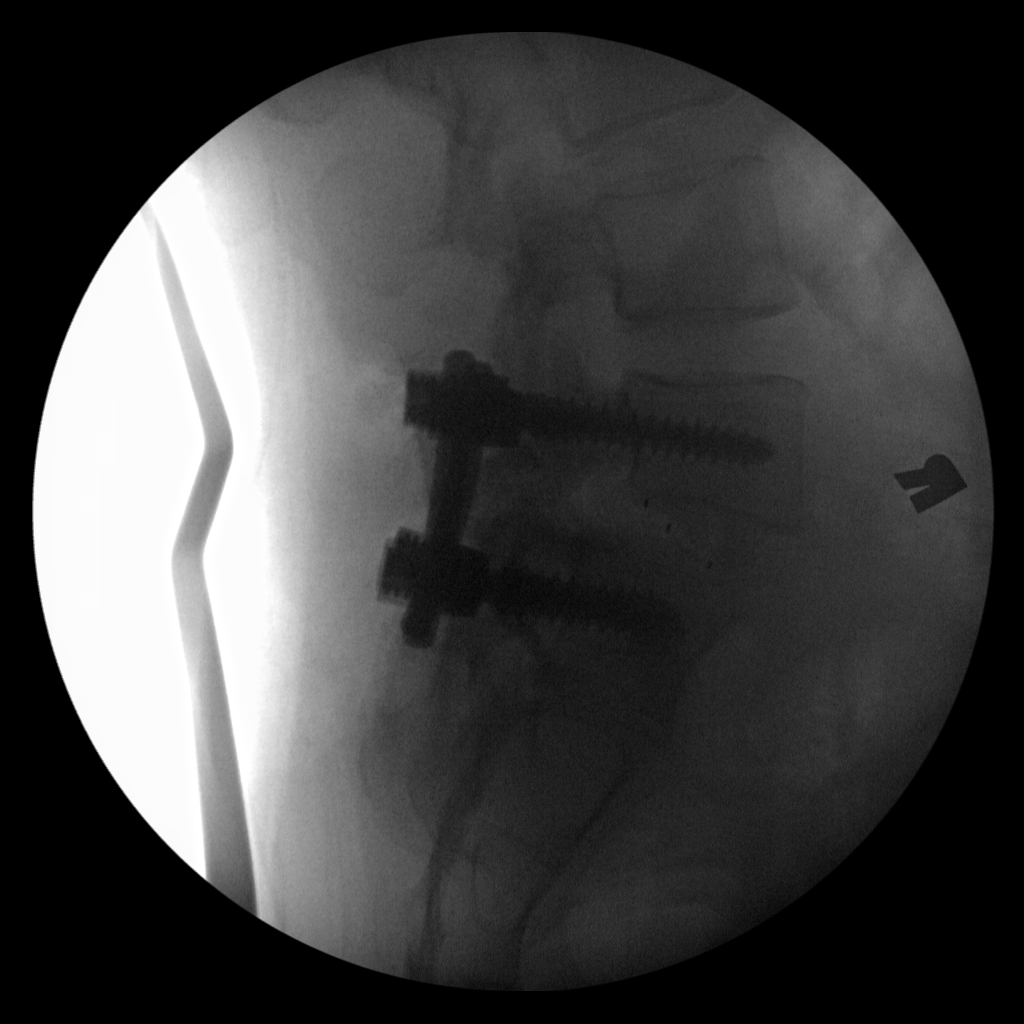
[im 2/2]
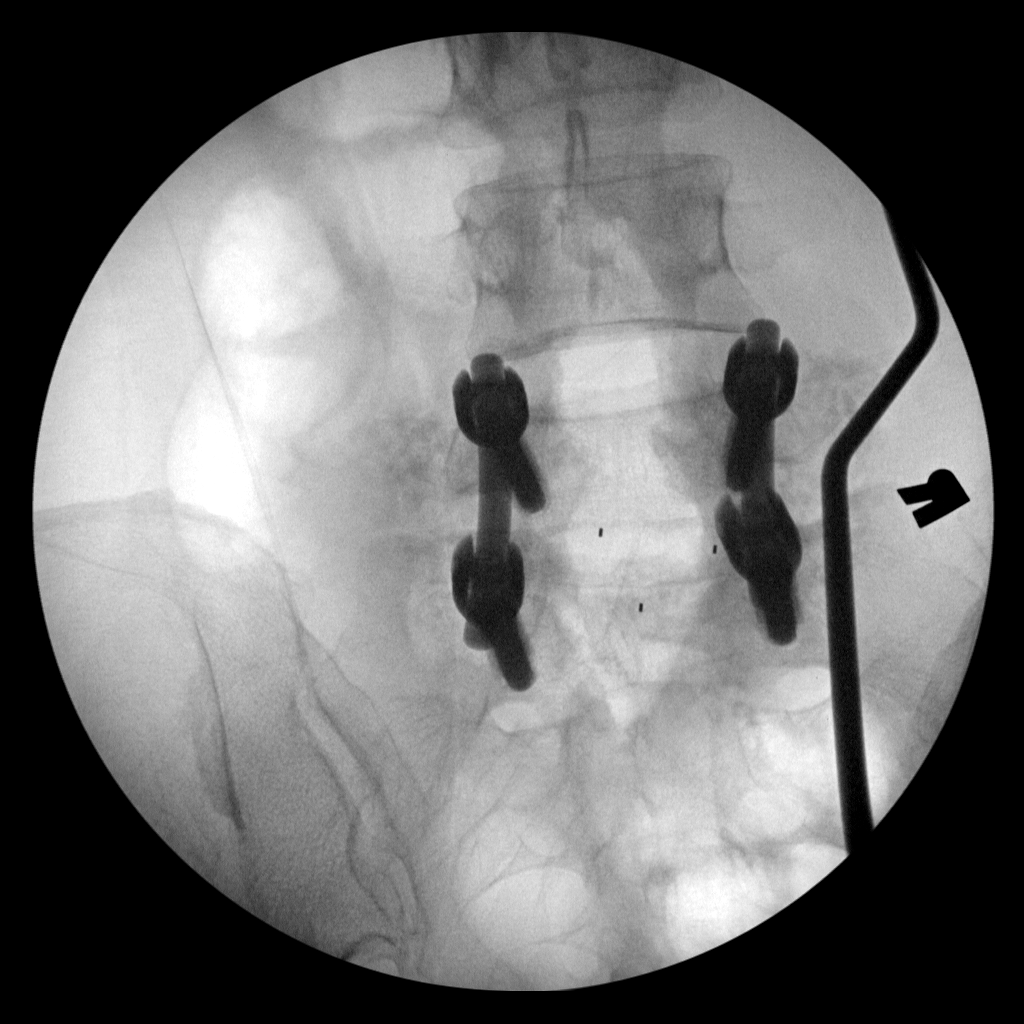

[2 of 2 positions shown; findings below may reference images not displayed]

FINDINGS: C-arm films document revision of L5-S1 PLIF. Pedicle
screw and interbody cage grossly satisfactory position.  Slight
anterolisthesis L5-S1.  Recommend plain films postoperatively to
assess.
IMPRESSION: As above.

## 2018-05-10 ENCOUNTER — Institutional Professional Consult (permissible substitution): Payer: Self-pay | Admitting: Primary Care

## 2018-05-20 ENCOUNTER — Encounter: Payer: Self-pay | Admitting: Pulmonary Disease

## 2018-05-20 ENCOUNTER — Ambulatory Visit: Payer: BLUE CROSS/BLUE SHIELD | Admitting: Pulmonary Disease

## 2018-05-20 DIAGNOSIS — R05 Cough: Secondary | ICD-10-CM | POA: Diagnosis not present

## 2018-05-20 DIAGNOSIS — R059 Cough, unspecified: Secondary | ICD-10-CM

## 2018-05-20 NOTE — Progress Notes (Signed)
Subjective:    Patient ID: Danielle Watkins, female    DOB: 1970/11/21, 48 y.o.   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  HENT: Positive for congestion, sore throat and trouble swallowing. Negative for dental problem, ear pain, nosebleeds, postnasal drip, rhinorrhea, sinus pressure and sneezing.   Eyes: Negative for redness and itching.  Respiratory: Positive for cough. Negative for chest tightness, shortness of breath and wheezing.   Cardiovascular: Negative for palpitations and leg swelling.  Gastrointestinal: Negative for nausea and vomiting.  Genitourinary: Negative for dysuria.  Musculoskeletal: Negative for joint swelling.  Skin: Negative for rash.  Allergic/Immunologic: Negative.  Negative for environmental allergies, food allergies and immunocompromised state.  Neurological: Negative for headaches.  Hematological: Does not bruise/bleed easily.  Psychiatric/Behavioral: Negative for dysphoric mood. The patient is not nervous/anxious.        Objective:   Physical  Exam Constitutional:      Appearance: Normal appearance.  HENT:     Head: Normocephalic and atraumatic.     Nose: Nose normal. No congestion or rhinorrhea.     Mouth/Throat:     Mouth: Mucous membranes are moist.  Eyes:     General:        Right eye: No discharge.        Left eye: No discharge.     Pupils: Pupils are equal, round, and reactive to light.  Neck:     Musculoskeletal: Normal range of motion and neck supple. No neck rigidity.  Cardiovascular:     Rate and Rhythm: Regular rhythm. Tachycardia present.     Heart sounds: No murmur.  Pulmonary:     Effort: No respiratory distress.     Breath sounds: No stridor. No wheezing, rhonchi or rales.  Chest:     Chest wall: No tenderness.  Abdominal:     General: Abdomen is flat. Bowel sounds are normal. There is no distension.  Musculoskeletal: Normal range of motion.  Skin:    General: Skin is warm and dry.  Neurological:     General: No focal deficit present.     Mental Status: She is alert.    Data: She had had a QuantiFERON done which was negative CBC, allergy panel testing negative      Assessment & Plan:  Hemoptysis -She did cough up blood for about 10 to 15 days, as since resolved -The prior episode the month before -She does have some nasal congestion but she feels she is more congested nasally following developing a cough with expectoration -Flonase and Allegra did not seem to help -  She did find relief following the steroid shot and prednisone  Plan: We will obtain a CT scan of the chest We will plan for bronchoscopy to assess for any endobronchial lesions Pulmonary function testing Obtain thyroid function test for concern with sweating at night  I will tentatively see her back in the office in about 4 weeks Encouraged to call with any significant concerns  She may require GI evaluation for swallowing difficulty

## 2018-05-20 NOTE — Patient Instructions (Signed)
History of hemoptysis Concerned with family history of cancers  Recent chest x-ray negative Blood work have been negative  We will order a CT scan of the chest without contrast to assess for any other anatomical abnormality Pulmonary function study Bronchoscopy to visualize the airway to make sure there is no growth/abnormality  I will see you back in the office in 4 weeks Call with any significant concerns

## 2018-05-20 NOTE — Addendum Note (Signed)
Addended by: Demetrio Lapping E on: 05/20/2018 10:06 AM   Modules accepted: Orders

## 2018-05-21 LAB — THYROID PANEL WITH TSH
Free Thyroxine Index: 2.2 (ref 1.4–3.8)
T3 UPTAKE: 29 % (ref 22–35)
T4 TOTAL: 7.5 ug/dL (ref 5.1–11.9)
TSH: 3.29 mIU/L

## 2018-05-27 ENCOUNTER — Telehealth: Payer: Self-pay | Admitting: Pulmonary Disease

## 2018-05-27 NOTE — Telephone Encounter (Signed)
Called and spoke Patient's Husband, Raiford Noble.  He stated that they had a question out total cost and out of pocket cost for Patient, who is having a CT chest, 06/01/18.  Advised him that he may have to contact insurance company.    PCC's, can you assist with this?

## 2018-05-27 NOTE — Telephone Encounter (Signed)
We dont have the cost for Ct's this may be something Marisue Ivan may have the answer on

## 2018-05-27 NOTE — Telephone Encounter (Signed)
I spoke to [place] w/ [facility] who advised what the expected charges they will be the insurance.  I contacted Mr. Laurel & let him know.  Mr. Corban verbalized understanding & states nothing further needed at this time.

## 2018-06-01 ENCOUNTER — Ambulatory Visit (INDEPENDENT_AMBULATORY_CARE_PROVIDER_SITE_OTHER)
Admission: RE | Admit: 2018-06-01 | Discharge: 2018-06-01 | Disposition: A | Discharge status: Home / Self Care | Payer: BLUE CROSS/BLUE SHIELD | Source: Ambulatory Visit | Attending: Pulmonary Disease | Admitting: Pulmonary Disease

## 2018-06-01 DIAGNOSIS — R05 Cough: Secondary | ICD-10-CM | POA: Diagnosis not present

## 2018-06-01 DIAGNOSIS — R059 Cough, unspecified: Secondary | ICD-10-CM

## 2018-06-10 ENCOUNTER — Telehealth: Payer: Self-pay | Admitting: Pulmonary Disease

## 2018-06-10 DIAGNOSIS — R911 Solitary pulmonary nodule: Secondary | ICD-10-CM

## 2018-06-10 NOTE — Telephone Encounter (Signed)
LMTCB x1 for pt's husband, Rickey.

## 2018-06-10 NOTE — Telephone Encounter (Signed)
Small 5 mm nodule in right lung  Does not require any invasive intervention or sampling  Usual follow-up is to repeat CT in 6 months to assure stability and then repeat one about 18 months after that  Usually an incidental finding   Please schedule for repeat CT in 6 months

## 2018-06-10 NOTE — Telephone Encounter (Signed)
Spoke with pt's husband, Rickey. He is requesting pt's CT results from 06/01/2018.  Dr. Wynona Neat - please advise on results. Thanks!  Pt does not speak English but we do not have a DPR on file for the pt. [name] is listed as an emergency contact. We will need to update the pt's DPR at her next OV.

## 2018-06-11 NOTE — Telephone Encounter (Signed)
Called and spoke with Patients Husband, Danielle Watkins.  Dr Wynona Neat results and recommendations given.  Understanding stated. CT chest ordered for 6 months.  Nothing further at this time.

## 2018-06-21 ENCOUNTER — Ambulatory Visit: Payer: BLUE CROSS/BLUE SHIELD | Admitting: Pulmonary Disease

## 2018-06-21 ENCOUNTER — Encounter: Payer: Self-pay | Admitting: Pulmonary Disease

## 2018-06-21 ENCOUNTER — Ambulatory Visit (INDEPENDENT_AMBULATORY_CARE_PROVIDER_SITE_OTHER): Payer: BLUE CROSS/BLUE SHIELD | Admitting: Pulmonary Disease

## 2018-06-21 VITALS — BP 124/80 | HR 75 | Ht 59.5 in | Wt 116.0 lb

## 2018-06-21 DIAGNOSIS — R05 Cough: Secondary | ICD-10-CM

## 2018-06-21 DIAGNOSIS — R042 Hemoptysis: Secondary | ICD-10-CM

## 2018-06-21 DIAGNOSIS — R059 Cough, unspecified: Secondary | ICD-10-CM

## 2018-06-21 LAB — PULMONARY FUNCTION TEST
DL/VA % pred: 131 %
DL/VA: 5.48 ml/min/mmHg/L
DLCO UNC: 20.23 ml/min/mmHg
DLCO unc % pred: 111 %
FEF 25-75 Post: 3.47 L/sec
FEF 25-75 Pre: 3.08 L/sec
FEF2575-%Change-Post: 12 %
FEF2575-%PRED-PRE: 123 %
FEF2575-%Pred-Post: 139 %
FEV1-%CHANGE-POST: 3 %
FEV1-%PRED-PRE: 100 %
FEV1-%Pred-Post: 104 %
FEV1-PRE: 2.3 L
FEV1-Post: 2.38 L
FEV1FVC-%CHANGE-POST: 0 %
FEV1FVC-%Pred-Pre: 105 %
FEV6-%CHANGE-POST: 3 %
FEV6-POST: 2.74 L
FEV6-Pre: 2.64 L
FEV6FVC-%CHANGE-POST: 0 %
FVC-%Change-Post: 3 %
FVC-%PRED-PRE: 95 %
FVC-%Pred-Post: 98 %
FVC-PRE: 2.64 L
FVC-Post: 2.74 L
Post FEV1/FVC ratio: 87 %
Post FEV6/FVC ratio: 100 %
Pre FEV1/FVC ratio: 87 %
Pre FEV6/FVC Ratio: 100 %
RV % pred: 69 %
RV: 1.03 L
TLC % PRED: 82 %
TLC: 3.62 L

## 2018-06-21 NOTE — Progress Notes (Signed)
Subjective:    Patient ID: Danielle PlattYan Watkins, female    DOB: 02-14-1971, 48 y.o.   MRN: [id number]  Patient with a history of hemoptysis   History of present illness: She did have an episode of hemoptysis about December 13 lasted about 10 to 15 days Was treated with a steroid shot and prednisone There was concern for nasal stuffiness and congestion was started on Flonase and allergy pills-Allegra Did not seem to help  She has not had any significant recurrence, may have had one episode of dry nose with some blood in it Otherwise symptoms are well controlled  She is feeling better at the present time   She usually feels that she has some chest congestion, increased mucus production  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  HENT: Positive for congestion. Negative for dental problem, ear pain, nosebleeds, postnasal drip, rhinorrhea, sinus pressure, sneezing, sore throat and trouble swallowing.   Eyes: Negative for redness and itching.  Respiratory: Negative for cough, chest tightness, shortness of breath and wheezing.   Cardiovascular: Negative for palpitations and leg swelling.  Gastrointestinal: Negative for nausea and vomiting.  Genitourinary: Negative for dysuria.  Musculoskeletal: Negative for joint swelling.  Skin: Negative for rash.  Allergic/Immunologic: Negative.  Negative for environmental allergies, food allergies and immunocompromised state.  Neurological: Negative for headaches.  Hematological: Does not bruise/bleed easily.  Psychiatric/Behavioral: Negative for dysphoric mood. The patient is not nervous/anxious.         Objective:   Physical Exam Constitutional:      Appearance: Normal appearance.  HENT:     Head: Normocephalic and atraumatic.     Nose: Nose normal. No congestion or rhinorrhea.     Mouth/Throat:     Mouth: Mucous membranes are moist.  Eyes:     General:        Right eye: No discharge.        Left eye: No discharge.     Pupils: Pupils are equal, round, and reactive to light.  Neck:     Musculoskeletal: Normal range of motion and neck supple. No neck rigidity.  Cardiovascular:     Rate and Rhythm: Regular rhythm. Tachycardia present.     Heart sounds: No murmur.  Pulmonary:     Effort: No respiratory distress.     Breath sounds: No stridor. No wheezing, rhonchi or rales.  Chest:     Chest wall: No tenderness.  Abdominal:     General: Abdomen is flat. Bowel sounds are normal. There is no distension.  Neurological:     Mental Status: She is alert.    Data: She had had a QuantiFERON done which was negative CBC, allergy panel testing negative    PFT within normal limits CT scan with small lung nodule that will be followed up Assessment & Plan:  Hemoptysis -She did cough up blood, resolved -The prior episode the month before -She does have some nasal congestion but she feels she is more congested nasally following developing a cough with expectoration -Symptoms are much better Plan: We will obtain a CT scan of the chest-we will follow-up with a repeat CT  Pulmonary function testing Obtain  thyroid function test for concern with sweating at night  I will tentatively see her back in the office in about 6 months Bronchoscopy not indicated at present Encouraged to call with any significant concerns

## 2018-06-21 NOTE — Progress Notes (Signed)
PFT done today. 

## 2018-06-21 NOTE — Patient Instructions (Signed)
Previous history of hemoptysis Stable status  We will repeat CT as discussed  I will see her back in the office in about 6 months Call with any significant concerns

## 2018-12-10 ENCOUNTER — Other Ambulatory Visit: Payer: BLUE CROSS/BLUE SHIELD

## 2019-12-07 ENCOUNTER — Ambulatory Visit: Payer: Self-pay

## 2019-12-07 ENCOUNTER — Encounter: Payer: Self-pay | Admitting: Surgery

## 2019-12-07 ENCOUNTER — Ambulatory Visit (INDEPENDENT_AMBULATORY_CARE_PROVIDER_SITE_OTHER): Payer: BC Managed Care – PPO | Admitting: Surgery

## 2019-12-07 VITALS — BP 128/90 | HR 87 | Ht 59.5 in | Wt 116.0 lb

## 2019-12-07 DIAGNOSIS — G8929 Other chronic pain: Secondary | ICD-10-CM

## 2019-12-07 DIAGNOSIS — Z981 Arthrodesis status: Secondary | ICD-10-CM | POA: Diagnosis not present

## 2019-12-07 DIAGNOSIS — M533 Sacrococcygeal disorders, not elsewhere classified: Secondary | ICD-10-CM

## 2019-12-07 DIAGNOSIS — M4306 Spondylolysis, lumbar region: Secondary | ICD-10-CM

## 2019-12-07 NOTE — Progress Notes (Signed)
Office Visit Note   Patient: Danielle Watkins           Date of Birth: 1970/09/02           MRN: [id number] Visit Date: 12/07/2019              Requested by: Regino Bellow, MD [address] [place],  [place] [address] PCP: Regino Bellow, MD   Assessment & Plan: Visit Diagnoses:  1. Spondylolysis of lumbar region   2. Chronic left SI joint pain   3. Status post lumbar spinal fusion     Plan: I reviewed x-rays with patient and her husband who is present. With pain that she is specifically localizing over the left SI joint I think it is reasonable to try an injection there. I asked Dr. Prince Rome to perform a diagnostic/therapeutic left SI joint Marcaine/Depo-Medrol injection. I asked patient to pay close attention to how she feels after this is done. She will follow-up with me in 2 weeks for recheck. Discontinue Voltaren gel. If she does not get any relief from the injection I will plan to get further diagnostic imaging of her lumbar spine. All questions answered. Also reviewed patient's chart and she had a previous history of lower GI bleed. Advised that I would not recommend any oral NSAIDs and they can leave this up to her primary care physician.  Follow-Up Instructions: Return in about 2 weeks (around 12/21/2019) for With Fayrene Fearing for recheck after left diagnostic/therapeutic SI joint injection.   Orders:  Orders Placed This Encounter  Procedures  . XR Lumbar Spine 2-3 Views  . US Guided Needle Placement   No orders of the defined types were placed in this encounter.     Procedures: No procedures performed   Clinical Data: No additional findings.   Subjective: Chief Complaint  Patient presents with  . Lower Back - Pain    HPI 49 year old Asian female comes in for evaluation of left-sided low back pain and intermittent left leg pain. Patient is status post L5-S1 fusion by Dr. Ophelia Charter in 2013. Is been several years since we have seen her back in the clinic. Patient states that  since her surgery she has had chronic low back pain. This has been treated at times by her primary care physician and I see that she has had oral prednisone taper and also Robaxin in the past. Localizes most of her pain to the left SI joint. This is aggravated with prolonged sitting and sometimes when she is laying down. Has intermittent pain going down to the left calf. No symptoms on the right side. Intermittently uses ibuprofen. She has had some issue with lower GI bleed couple years ago. No current issues. Review of Systems No current cardiac pulmonary GI GU issues  Objective: Vital Signs: BP (!) 128/90   Pulse 87   Ht 4' 11.5" (1.511 m)   Wt 116 lb (52.6 kg)   BMI 23.04 kg/m   Physical Exam HENT:     Head: Normocephalic and atraumatic.  Eyes:     Extraocular Movements: Extraocular movements intact.     Pupils: Pupils are equal, round, and reactive to light.  Pulmonary:     Effort: No respiratory distress.  Musculoskeletal:     Comments: Gait is normal. Moderate tenderness over the left SI joint. Nontender over the right side. Bilateral sciatic notch nontender. No pain with lumbar extension. Lumbar flexion hands to knees. Negative logroll bilateral hips. Negative straight leg raise. Positive left FABER test.  Negative on the right side. Bilateral calves are tender. Neuro vas intact. No focal motor deficits.  Neurological:     General: No focal deficit present.     Mental Status: She is alert and oriented to person, place, and time.     Ortho Exam  Specialty Comments:  No specialty comments available.  Imaging: No results found.   PMFS History: Patient Active Problem List   Diagnosis Date Noted  . Spondylolysis of lumbar region 03/24/2012   Past Medical History:  Diagnosis Date  . Scoliosis     History reviewed. No pertinent family history.  Past Surgical History:  Procedure Laterality Date  . BACK SURGERY     lumbar fusion   Social History   Occupational  History  . Not on file  Tobacco Use  . Smoking status: Never Smoker  . Smokeless tobacco: Never Used  Substance and Sexual Activity  . Alcohol use: Yes  . Drug use: No  . Sexual activity: Not on file

## 2019-12-07 NOTE — Progress Notes (Signed)
Subjective: Patient is here for ultrasound-guided intra-articular left SI joint injection.   Chronic pain s/p lumbar fusion.  Objective:  Tender over left SI joint.  Procedure: Ultrasound-guided left SI injection: After sterile prep with Betadine, injected 5 cc 1% lidocaine without epinephrine and 40 mg methylprednisolone using a 22-gauge spinal needle, passing the needle through the sacroiliac ligament into the region of the SI joint.  Good immediate relief.

## 2019-12-21 ENCOUNTER — Encounter: Payer: Self-pay | Admitting: Surgery

## 2019-12-21 ENCOUNTER — Ambulatory Visit (INDEPENDENT_AMBULATORY_CARE_PROVIDER_SITE_OTHER): Payer: BC Managed Care – PPO | Admitting: Surgery

## 2019-12-21 DIAGNOSIS — M533 Sacrococcygeal disorders, not elsewhere classified: Secondary | ICD-10-CM

## 2019-12-21 DIAGNOSIS — G8929 Other chronic pain: Secondary | ICD-10-CM

## 2019-12-21 NOTE — Progress Notes (Signed)
49 year old female comes in with interpreter today for recheck of left SI joint pain.  Last office visit she had ultrasound-guided left SI joint Marcaine/Depo-Medrol injection with Dr. Prince Rome.  Patient states that she had complete relief of her pain x2 days.  Currently she is 50% better from the initial visit.    Plan Advised patient and her husband who is present that this is very encouraging since she had excellent diagnostic relief.  Since she is still 50% better I think that there is a chance that she may continue to have improvement over the next few weeks.  Follow-up me in 4 weeks for recheck to see how she is doing.  If pain worsens again contact me sooner and I will plan on sending her to Dr. Alvester Morin for fluoroscopic guided left SI joint injection.  Since she had good diagnostic relief I think at some point she may be a good candidate for SI joint radiofrequency ablation.  All questions answered.  Interpreter was very helpful during the visit and patient had a complete understanding of my plan.

## 2020-01-05 ENCOUNTER — Ambulatory Visit (INDEPENDENT_AMBULATORY_CARE_PROVIDER_SITE_OTHER): Payer: BC Managed Care – PPO | Admitting: Surgery

## 2020-01-05 ENCOUNTER — Encounter: Payer: Self-pay | Admitting: Surgery

## 2020-01-05 VITALS — BP 144/90 | HR 84 | Ht 59.5 in | Wt 116.0 lb

## 2020-01-05 DIAGNOSIS — M533 Sacrococcygeal disorders, not elsewhere classified: Secondary | ICD-10-CM | POA: Diagnosis not present

## 2020-01-05 DIAGNOSIS — M5416 Radiculopathy, lumbar region: Secondary | ICD-10-CM

## 2020-01-05 DIAGNOSIS — G8929 Other chronic pain: Secondary | ICD-10-CM | POA: Diagnosis not present

## 2020-01-05 DIAGNOSIS — M4306 Spondylolysis, lumbar region: Secondary | ICD-10-CM | POA: Diagnosis not present

## 2020-01-05 NOTE — Progress Notes (Signed)
° °  Office Visit Note   Patient: Danielle Watkins           Date of Birth: [birth year]           MRN: [id number] Visit Date: 01/05/2020              Requested by: Regino Bellow, MD [address] [place],  [place] [address] PCP: Regino Bellow, MD   Assessment & Plan: Visit Diagnoses:  1. Spondylolysis of lumbar region   2. Chronic left SI joint pain   3. Radiculopathy, lumbar region     Plan: With patient's ongoing low back pain and new symptoms recommend getting a lumbar MRI.  She will follow with Dr. Ophelia Charter after completion to discuss results and further treatment options.  Follow-Up Instructions: Return in about 2 weeks (around 01/19/2020) for WITH DR YATES TO REVIEW LUMBAR MRI.   Orders:  No orders of the defined types were placed in this encounter.  No orders of the defined types were placed in this encounter.     Procedures: No procedures performed   Clinical Data: No additional findings.   Subjective: Chief Complaint  Patient presents with   Follow-up    Left SI Joint    HPI Patient states that she continues have ongoing low back pain but this is also worsened since last office visit with me.  She is also having a burning pain radiating into the bilateral calf area.     Objective: Vital Signs: BP (!) 144/90    Pulse 84    Ht 4' 11.5" (1.511 m)    Wt 116 lb (52.6 kg)    BMI 23.04 kg/m   Physical Exam Gait is normal.  negative straight leg raise.  No focal motor deficits.  Specialty Comments:  No specialty comments available.  Imaging: No results found.   PMFS History: Patient Active Problem List   Diagnosis Date Noted   Spondylolysis of lumbar region 03/24/2012   Past Medical History:  Diagnosis Date   Scoliosis     History reviewed. No pertinent family history.  Past Surgical History:  Procedure Laterality Date   BACK SURGERY     lumbar fusion   Social History   Occupational History   Not on file  Tobacco Use   Smoking status:  Never Smoker   Smokeless tobacco: Never Used  Substance and Sexual Activity   Alcohol use: Yes   Drug use: No   Sexual activity: Not on file

## 2020-01-12 ENCOUNTER — Other Ambulatory Visit: Payer: Self-pay | Admitting: Surgery

## 2020-01-12 DIAGNOSIS — M545 Low back pain, unspecified: Secondary | ICD-10-CM

## 2020-01-17 ENCOUNTER — Encounter: Payer: Self-pay | Admitting: Radiology

## 2020-01-17 ENCOUNTER — Encounter: Payer: Self-pay | Admitting: Orthopaedic Surgery

## 2020-01-18 ENCOUNTER — Ambulatory Visit: Payer: BC Managed Care – PPO | Admitting: Surgery

## 2020-01-20 ENCOUNTER — Encounter: Payer: Self-pay | Admitting: Radiology

## 2020-01-21 ENCOUNTER — Ambulatory Visit (HOSPITAL_BASED_OUTPATIENT_CLINIC_OR_DEPARTMENT_OTHER)
Admission: RE | Admit: 2020-01-21 | Discharge: 2020-01-21 | Disposition: A | Discharge status: Home / Self Care | Payer: BC Managed Care – PPO | Source: Ambulatory Visit | Attending: Surgery | Admitting: Surgery

## 2020-01-21 ENCOUNTER — Other Ambulatory Visit: Payer: Self-pay

## 2020-01-21 DIAGNOSIS — M545 Low back pain, unspecified: Secondary | ICD-10-CM

## 2020-02-01 ENCOUNTER — Encounter: Payer: Self-pay | Admitting: Orthopaedic Surgery

## 2020-02-01 ENCOUNTER — Ambulatory Visit (INDEPENDENT_AMBULATORY_CARE_PROVIDER_SITE_OTHER): Payer: BC Managed Care – PPO | Admitting: Orthopaedic Surgery

## 2020-02-01 DIAGNOSIS — Z981 Arthrodesis status: Secondary | ICD-10-CM

## 2020-02-01 DIAGNOSIS — M5136 Other intervertebral disc degeneration, lumbar region: Secondary | ICD-10-CM

## 2020-02-01 NOTE — Progress Notes (Signed)
Office Visit Note   Patient: Danielle Watkins           Date of Birth: [birth year]           MRN: [id number] Visit Date: 02/01/2020              Requested by: Regino Bellow, MD [address] [place],  [place] [address] PCP: Regino Bellow, MD   Assessment & Plan: Visit Diagnoses:  1. History of lumbar spinal fusion   2. Other intervertebral disc degeneration, lumbar region     Plan: Patient has solid fusion L5-S1.  Patient has left subarticular disc protrusion at the posterior annular fissure at L4-5.  New central posterior annular fissure at L3-4.  L5-S1 is well decompressed.  We discussed options including facet injection or foraminal injection.  We reviewed the new MRI at this point surgery is not recommended.  She might be a good candidate for facet rhizotomy a facet injection gave her good relief.  Difficult determine whether pain is related to the annular tear versus some facet degeneration on the left at L4-5.  She has increased symptoms she can call and we can refer her to Dr. Alvester Morin for consideration of injection.  Follow-Up Instructions: No follow-ups on file.   Orders:  No orders of the defined types were placed in this encounter.  No orders of the defined types were placed in this encounter.     Procedures: No procedures performed   Clinical Data: No additional findings.   Subjective: Chief Complaint  Patient presents with  . Lower Back - Pain, Follow-up    MRI lumbar review    HPI 49 year old female [place] here with her [place] husband in attendance interpreter with history of lumbar fusion revision in 2013 by me for broken [place] hardware that is been taken off the market.  She had broken rods with pseudoarthrosis and had revision surgery now has a solid fusion.  In the last year she has had some increased aching in her back usually more on the left side she does better standing she is on her feet when she works during the day.  She has increased aching  when she sits and is here for MRI review.  Plain radiographs show solid fusion L5-S1.  Review of Systems all other systems are negative non-smoker she has been healthy and active.   Objective: Vital Signs: BP (!) 148/89   Pulse 89   Ht 4' 11.5" (1.511 m)   Wt 116 lb (52.6 kg)   BMI 23.04 kg/m   Physical Exam Constitutional:      Appearance: She is well-developed.  HENT:     Head: Normocephalic.     Right Ear: External ear normal.     Left Ear: External ear normal.  Eyes:     Pupils: Pupils are equal, round, and reactive to light.  Neck:     Thyroid: No thyromegaly.     Trachea: No tracheal deviation.  Cardiovascular:     Rate and Rhythm: Normal rate.  Pulmonary:     Effort: Pulmonary effort is normal.  Abdominal:     Palpations: Abdomen is soft.  Skin:    General: Skin is warm and dry.  Neurological:     Mental Status: She is alert and oriented to person, place, and time.  Psychiatric:        Behavior: Behavior normal.     Ortho Exam patient is ambulatory incision is well-healed.  She ambulates without limp no isolated  motor weakness.  Some sciatic notch tenderness on the left.  Specialty Comments:  No specialty comments available.  Imaging:CLINICAL DATA:  Bilateral low back pain without sciatica, unspecified chronicity. Additional history provided by scanning technologist: Patient reports chronic low back pain with burning pain in bilateral legs, worse for 2-3 months, prior surgery in 2013.  EXAM: MRI LUMBAR SPINE WITHOUT CONTRAST  TECHNIQUE: Multiplanar, multisequence MR imaging of the lumbar spine was performed. No intravenous contrast was administered.  COMPARISON:  Radiographs of the lumbar spine 12/07/2019. lumbar spine MRI 11/14/2011.  FINDINGS: Segmentation: Spinal numbering will remain consistent with that utilized on the prior MRI of 11/14/2011. The lowest well-formed intervertebral disc space is designated L5-S1. There is a rudimentary  interspace at S1-S2.  Alignment: Mild reversal of the expected lumbar lordosis. Trace L3-L4 and L4-L5 grade 1 retrolisthesis. 3 mm L5-S1 grade 1 anterolisthesis. These findings are unchanged.  Vertebrae: Redemonstrated mild chronic L1 superior endplate compression fracture. Vertebral body height is otherwise maintained. No significant marrow edema is identified.  Conus medullaris and cauda equina: Conus extends to the L1-L2 level. No signal abnormality within the visualized distal spinal cord.  Paraspinal and other soft tissues: No abnormality identified within included portions of the abdomen/retroperitoneum. Postsurgical changes to the dorsal lower lumbar paraspinal soft tissues.  Disc levels:  Unless otherwise stated, the level by level findings below have not significantly changed since prior MRI 11/14/2011.  Multilevel disc degeneration. Most notably, there is moderate disc degeneration at T12-L1 and mild disc degeneration at L3-L4 and L4-L5.  T12-L1: Shallow disc bulge. No significant spinal canal stenosis or neural foraminal narrowing.  L1-L2: No significant disc herniation or stenosis.  L2-L3: No significant disc herniation or stenosis.  L3-L4: Central posterior annular fissure, new from the prior exam. Progressive disc bulge. New superimposed broad-based right center to right foraminal disc protrusion. Progressive mild/moderate right subarticular narrowing with slight crowding of the descending right L4 nerve root. Central canal patent. No significant foraminal stenosis.  L4-L5: Since the prior MRI of 11/14/2011, there has been interval posterior decompression. Disc bulge. Redemonstrated superimposed left center/subarticular disc protrusion at site of posterior annular fissure. The disc protrusion contributes to persistent mild left subarticular narrowing with possible contact upon the descending left L5 nerve root (series 5, image 29). Unchanged  mild right subarticular narrowing without frank nerve root impingement. Central canal patent. No significant foraminal stenosis  L5-S1: Redemonstrated sequela of prior posterior decompression and posterior spinal fusion. Grade 1 anterolisthesis. No significant spinal canal stenosis. Suspected mild residual bilateral neural foraminal narrowing.  IMPRESSION: Comparison is made with the prior MRI of 11/14/2011.  Lumbar spondylosis and postoperative changes as outlined with findings most notably as follows.  At L3-L4, there is a central posterior annular fissure which is new from the prior exam. A progressive disc bulge contributes to progressive mild/moderate right subarticular narrowing with slight crowding of the descending right L4 nerve root.  At L4-L5, there has been interval posterior decompression. Disc bulge. Redemonstrated superimposed left center/subarticular disc protrusion at site of posterior annular fissure. The disc protrusion contributes to persistent mild left subarticular narrowing with possible contact upon the descending left L5 nerve root. Unchanged mild right subarticular narrowing without frank nerve root impingement.  At L5-S1, there is redemonstrated postoperative changes from prior posterior decompression and posterior spinal fusion. No significant spinal canal stenosis. Suspected mild bilateral neural foraminal narrowing.  Mild multilevel spondylolisthesis, as described and unchanged.   Electronically Signed   By: Jackey Loge DO  On: 01/21/2020 19:45    PMFS History: Patient Active Problem List   Diagnosis Date Noted  . History of lumbar spinal fusion 02/01/2020  . Other intervertebral disc degeneration, lumbar region 02/01/2020  . Spondylolysis of lumbar region 03/24/2012   Past Medical History:  Diagnosis Date  . Scoliosis     No family history on file.  Past Surgical History:  Procedure Laterality Date  . BACK SURGERY      lumbar fusion   Social History   Occupational History  . Not on file  Tobacco Use  . Smoking status: Never Smoker  . Smokeless tobacco: Never Used  Substance and Sexual Activity  . Alcohol use: Yes  . Drug use: No  . Sexual activity: Not on file

## 2022-07-10 ENCOUNTER — Encounter: Payer: Self-pay | Admitting: Radiology
